# Patient Record
Sex: Female | Born: 1940
Health system: Southern US, Community
[De-identification: ages and names within clinical notes are randomized; demographics above are authoritative.]

## PROBLEM LIST (undated history)

## (undated) DIAGNOSIS — E079 Disorder of thyroid, unspecified: Secondary | ICD-10-CM

## (undated) HISTORY — DX: Disorder of thyroid, unspecified: E07.9

---

## 2013-06-29 DIAGNOSIS — R4182 Altered mental status, unspecified: Secondary | ICD-10-CM | POA: Insufficient documentation

## 2013-06-29 DIAGNOSIS — K219 Gastro-esophageal reflux disease without esophagitis: Secondary | ICD-10-CM | POA: Insufficient documentation

## 2013-06-29 DIAGNOSIS — D649 Anemia, unspecified: Secondary | ICD-10-CM | POA: Insufficient documentation

## 2013-06-29 DIAGNOSIS — Z853 Personal history of malignant neoplasm of breast: Secondary | ICD-10-CM | POA: Insufficient documentation

## 2013-09-17 DIAGNOSIS — G4700 Insomnia, unspecified: Secondary | ICD-10-CM | POA: Insufficient documentation

## 2013-09-17 DIAGNOSIS — F419 Anxiety disorder, unspecified: Secondary | ICD-10-CM | POA: Insufficient documentation

## 2015-12-08 LAB — HM DEXA SCAN

## 2015-12-08 LAB — HM MAMMOGRAPHY

## 2016-11-16 ENCOUNTER — Ambulatory Visit (INDEPENDENT_AMBULATORY_CARE_PROVIDER_SITE_OTHER): Payer: Medicare Other | Admitting: Osteopathic Medicine

## 2016-11-16 DIAGNOSIS — Z0289 Encounter for other administrative examinations: Secondary | ICD-10-CM

## 2016-11-16 NOTE — Progress Notes (Signed)
Patient was here for her appointment but then left, was gone past her appointment time, did not alert front desk - we checked the parking lot to make sure she wasn't out there fallen or other problem. I'm going to therefore count this as a no show since patient was not ready at time of appointment or 10 minutes after her appointment time. If late/no-show again, will not accept as new patient.

## 2016-11-24 ENCOUNTER — Ambulatory Visit (INDEPENDENT_AMBULATORY_CARE_PROVIDER_SITE_OTHER): Payer: Medicare Other | Admitting: Osteopathic Medicine

## 2016-11-24 ENCOUNTER — Encounter: Payer: Self-pay | Admitting: Osteopathic Medicine

## 2016-11-24 VITALS — BP 147/74 | HR 76 | Ht 64.0 in | Wt 122.0 lb

## 2016-11-24 DIAGNOSIS — I499 Cardiac arrhythmia, unspecified: Secondary | ICD-10-CM

## 2016-11-24 DIAGNOSIS — E785 Hyperlipidemia, unspecified: Secondary | ICD-10-CM

## 2016-11-24 DIAGNOSIS — I6523 Occlusion and stenosis of bilateral carotid arteries: Secondary | ICD-10-CM | POA: Diagnosis not present

## 2016-11-24 DIAGNOSIS — L409 Psoriasis, unspecified: Secondary | ICD-10-CM | POA: Insufficient documentation

## 2016-11-24 DIAGNOSIS — E039 Hypothyroidism, unspecified: Secondary | ICD-10-CM

## 2016-11-24 DIAGNOSIS — F419 Anxiety disorder, unspecified: Secondary | ICD-10-CM

## 2016-11-24 DIAGNOSIS — K219 Gastro-esophageal reflux disease without esophagitis: Secondary | ICD-10-CM

## 2016-11-24 DIAGNOSIS — Z0289 Encounter for other administrative examinations: Secondary | ICD-10-CM

## 2016-11-24 DIAGNOSIS — I6529 Occlusion and stenosis of unspecified carotid artery: Secondary | ICD-10-CM | POA: Insufficient documentation

## 2016-11-24 MED ORDER — PAROXETINE HCL 40 MG PO TABS: 40.0000 mg | ORAL_TABLET | Freq: Every day | ORAL | 3 refills | Status: DC

## 2016-11-24 MED ORDER — OMEPRAZOLE 20 MG PO CPDR: 20.0000 mg | DELAYED_RELEASE_CAPSULE | Freq: Every day | ORAL | 3 refills | Status: DC

## 2016-11-24 NOTE — Patient Instructions (Signed)
Plan: 1. Labs today to check thyroid.  2. Refills sent for needed prescriptions.  3. As long as you are feeling well, plan to come back for your annual wellness physical and routine labs in the fall. Please call our office a week before your appointment (or when you get the confirmation/reminder phone call from [place]) so we can make sure your lab orders are in place. If you're able to get labs done a few days before your visit, we can discuss results when we see you in the office! If any problems arise prior to your wellness visit, please come see [place]!

## 2016-11-24 NOTE — Progress Notes (Signed)
HPI: Angela Myers is a 76 y.o. female  who presents to [facility] today, 11/24/16,  for chief complaint of:  Chief Complaint  Patient presents with  . Establish Care    Here to establish care.. Pt has no major concerns to address today.    CARDIOVASCULAR - Hx carotid stenosis on problem list but review of records notes no significant stenosis to require intervention (as of 2016). Hx HLD - is on statin and aspirin. No known Hx CAD. No CP/SOB, no palpitations.   RESPIRATORY - no issues  NEUROLOGICAL/PSYCH - anxiety issues well controlled on Paxil.   URINARY - occasional urgency, doesn't want to be on medications.   GASTROINTESTINAL - GERD well controlled on Prilosec, would like to continue this medication after discussion of risks/benefits long term PPI.   ENDOCRINE - Hypothyroid, doing well on current dose medication which she has been taking for years.     Past medical, surgical, social and family history reviewed: Patient Active Problem List   Diagnosis Date Noted  . Hypothyroidism 11/24/2016  . Carotid artery stenosis 11/24/2016  . HLD (hyperlipidemia) 11/24/2016  . Psoriasis 11/24/2016  . Anxiety disorder 09/17/2013  . Anemia 06/29/2013  . GERD (gastroesophageal reflux disease) 06/29/2013  . History of breast cancer 06/29/2013   History reviewed. No pertinent surgical history. Social History  Substance Use Topics  . Smoking status: Unknown If Ever Smoked  . Smokeless tobacco: Never Used  . Alcohol use Not on file   History reviewed. No pertinent family history.   Current medication list and allergy/intolerance information reviewed:   Current Outpatient Prescriptions  Medication Sig Dispense Refill  . PARoxetine (PAXIL) 40 MG tablet TAKE 1 TAB(S) ONCE DAILY ORALLY    . aspirin EC 81 MG tablet Take by mouth.    . levothyroxine (SYNTHROID, LEVOTHROID) 88 MCG tablet Take by mouth.    Marland Kitchen omeprazole (PRILOSEC) 20 MG capsule Take  by mouth.    . simvastatin (ZOCOR) 80 MG tablet      No current facility-administered medications for this visit.    Allergies  Allergen Reactions  . Fesoterodine Other (See Comments)    Abdominal pain 08/30/13 patient stated does not recall allergy  . Gabapentin Other (See Comments)    "felt like she was going to die" 08/30/13 patient stated does not recall allergy      Review of Systems:  Constitutional:  No  fever, no chills, No recent illness  HEENT: No  headache, no vision change, no hearing change,  Cardiac: No  chest pain, No  pressure, No palpitations  Respiratory:  No  shortness of breath. No  Cough  Gastrointestinal: No  abdominal pain, No  nausea, No  vomiting,  No  blood in stool, No  diarrhea, No  constipation   Musculoskeletal: No new myalgia/arthralgia  Genitourinary: +urinary incontinence, No  abnormal genital bleeding, No abnormal genital discharge  Skin: +Rash, No other wounds/concerning lesions  Hem/Onc: No  easy bruising/bleeding, No  abnormal lymph node  Endocrine: No cold intolerance,  No heat intolerance. No polyuria/polydipsia/polyphagia   Neurologic: No  weakness, No  dizziness, No  slurred speech/focal weakness/facial droop, +memory concerns  Psychiatric: No  concerns with depression, No  concerns with anxiety, No sleep problems, No mood problems  Exam:  BP (!) 147/74   Pulse 76   Ht 5\' 4"  (1.626 m)   Wt 122 lb (55.3 kg)   BMI 20.94 kg/m   Constitutional: VS see above. General Appearance:  alert, well-developed, well-nourished, NAD  Eyes: Normal lids and conjunctive, non-icteric sclera  Ears, Nose, Mouth, Throat: MMM, Normal external inspection ears/nares/mouth/lips/gums.   Neck: No masses, trachea midline. No thyroid enlargement. No tenderness/mass appreciated. No lymphadenopathy  Respiratory: Normal respiratory effort. no wheeze, no rhonchi, no rales  Cardiovascular: S1/S2 normal, no murmur, no rub/gallop auscultated. RRR except  early beats on occasion to auscultation. No lower extremity edema.   Gastrointestinal: Nontender, no masses. No hepatomegaly, no splenomegaly. No hernia appreciated. Bowel sounds normal. Rectal exam deferred.   Musculoskeletal: Gait normal. No clubbing/cyanosis of digits.   Neurological: Normal balance/coordination. No tremor.   Skin: warm, dry, intact. No rash/ulcer.  Psychiatric: Normal judgment/insight. Normal mood and affect. Oriented x3.    Results for orders placed or performed in visit on 11/24/16 (from the past 72 hour(s))  TSH     Status: None   Collection Time: 11/24/16  3:37 PM  Result Value Ref Range   TSH 0.72 mIU/L    Comment:   Reference Range   > or = 20 Years  0.40-4.50   Pregnancy Range First trimester  0.26-2.66 Second trimester 0.55-2.73 Third trimester  0.43-2.91        EKG interpretation: Rate: 68 Rhythm: sinus, regular  No ST/T changes concerning for acute ischemia/infarct     ASSESSMENT/PLAN:   Hypothyroidism, unspecified type - Plan: levothyroxine (SYNTHROID, LEVOTHROID) 100 MCG tablet, TSH  Irregular heart beat - Plan: EKG 12-Lead  Carotid atherosclerosis, bilateral - Plan: aspirin EC 81 MG tablet  Anxiety disorder, unspecified type - Plan: PARoxetine (PAXIL) 40 MG tablet  Gastroesophageal reflux disease, esophagitis presence not specified - Plan: omeprazole (PRILOSEC) 20 MG capsule  Self-referral - Plan: cyanocobalamin (V-R VITAMIN B-12) 500 MCG tablet, Pumpkin Seed-Soy Germ (AZO BLADDER CONTROL/GO-LESS) CAPS  Hyperlipidemia, unspecified hyperlipidemia type - Plan: simvastatin (ZOCOR) 80 MG tablet    Patient Instructions  Plan: 1. Labs today to check thyroid.  2. Refills sent for needed prescriptions.  3. As long as you are feeling well, plan to come back for your annual wellness physical and routine labs in the fall. Please call our office a week before your appointment (or when you get the confirmation/reminder phone call from  [place]) so we can make sure your lab orders are in place. If you're able to get labs done a few days before your visit, we can discuss results when we see you in the office! If any problems arise prior to your wellness visit, please come see [place]!     Visit summary with medication list and pertinent instructions was printed for patient to review. All questions at time of visit were answered - patient instructed to contact office with any additional concerns. ER/RTC precautions were reviewed with the patient. Follow-up plan: Return in about 6 months (around 05/26/2017) for [place], sooner if needed .   Note: Total time spent 25 minutes, greater than 50% of the visit was spent face-to-face counseling and coordinating care for the following: The primary encounter diagnosis was Hypothyroidism, unspecified type. Diagnoses of Irregular heart beat, Carotid atherosclerosis, bilateral, Anxiety disorder, unspecified type, Gastroesophageal reflux disease, esophagitis presence not specified, Self-referral, and Hyperlipidemia, unspecified hyperlipidemia type were also pertinent to this visit.Marland Kitchen

## 2016-11-25 ENCOUNTER — Encounter: Payer: Self-pay | Admitting: Osteopathic Medicine

## 2016-11-25 LAB — TSH: TSH: 0.72 mIU/L

## 2016-11-26 ENCOUNTER — Other Ambulatory Visit: Payer: Self-pay

## 2016-11-26 ENCOUNTER — Encounter: Payer: Self-pay | Admitting: Osteopathic Medicine

## 2016-11-26 DIAGNOSIS — F419 Anxiety disorder, unspecified: Secondary | ICD-10-CM

## 2016-11-26 MED ORDER — PAROXETINE HCL 40 MG PO TABS: 40.0000 mg | ORAL_TABLET | Freq: Every day | ORAL | 3 refills | Status: DC

## 2016-11-26 NOTE — Telephone Encounter (Signed)
Patient request refill for Paroxetine 40 mg #90 3 refills sent to [facility]  pharmacy. Trinetta Alemu,CMA

## 2016-11-29 ENCOUNTER — Encounter: Payer: Self-pay | Admitting: Osteopathic Medicine

## 2017-02-24 ENCOUNTER — Ambulatory Visit (INDEPENDENT_AMBULATORY_CARE_PROVIDER_SITE_OTHER): Payer: Medicare Other | Admitting: Osteopathic Medicine

## 2017-02-24 ENCOUNTER — Encounter: Payer: Self-pay | Admitting: Osteopathic Medicine

## 2017-02-24 VITALS — BP 136/60 | HR 80 | Wt 123.0 lb

## 2017-02-24 DIAGNOSIS — E039 Hypothyroidism, unspecified: Secondary | ICD-10-CM | POA: Diagnosis not present

## 2017-02-24 DIAGNOSIS — E559 Vitamin D deficiency, unspecified: Secondary | ICD-10-CM | POA: Diagnosis not present

## 2017-02-24 DIAGNOSIS — I6523 Occlusion and stenosis of bilateral carotid arteries: Secondary | ICD-10-CM | POA: Diagnosis not present

## 2017-02-24 DIAGNOSIS — E785 Hyperlipidemia, unspecified: Secondary | ICD-10-CM | POA: Diagnosis not present

## 2017-02-24 DIAGNOSIS — Z0000 Encounter for general adult medical examination without abnormal findings: Secondary | ICD-10-CM | POA: Diagnosis not present

## 2017-02-24 MED ORDER — ZOSTER VAC RECOMB ADJUVANTED 50 MCG/0.5ML IM SUSR: 0.5000 mL | Freq: Once | INTRAMUSCULAR | 1 refills | Status: AC

## 2017-02-24 NOTE — Patient Instructions (Addendum)
Plan:  Prescription for shingles shot if you decide to get this you can take it to your pharmacist   See printed information on advanced directive  Labs will be due 05/2017  If leg numbness or veins bother you, please come see me!

## 2017-02-24 NOTE — Progress Notes (Signed)
HPI: Angela Myers is a 76 y.o. female  who presents to [facility] today, 02/24/17,  for Medicare Annual Wellness Exam  Patient presents for annual physical/Medicare wellness exam. no complaints today.    Past medical, surgical, social and family history reviewed:  Patient Active Problem List   Diagnosis Date Noted  . Hypothyroidism 11/24/2016  . Carotid artery stenosis 11/24/2016  . HLD (hyperlipidemia) 11/24/2016  . Psoriasis 11/24/2016  . Anxiety disorder 09/17/2013  . Anemia 06/29/2013  . GERD (gastroesophageal reflux disease) 06/29/2013  . History of breast cancer 06/29/2013    No past surgical history on file.  Social History   Social History  . Marital status: Married    Spouse name: N/A  . Number of children: N/A  . Years of education: N/A   Occupational History  . Not on file.   Social History Main Topics  . Smoking status: Unknown If Ever Smoked  . Smokeless tobacco: Never Used  . Alcohol use Not on file  . Drug use: Unknown  . Sexual activity: Not on file   Other Topics Concern  . Not on file   Social History Narrative  . No narrative on file    Family History  Problem Relation Age of Onset  . Diabetes Maternal Aunt   . Heart attack Maternal Aunt      Current medication list and allergy/intolerance information reviewed:    Outpatient Encounter Prescriptions as of 02/24/2017  Medication Sig  . aspirin EC 81 MG tablet Take by mouth.  . cyanocobalamin (V-R VITAMIN B-12) 500 MCG tablet Take by mouth.  . levothyroxine (SYNTHROID, LEVOTHROID) 100 MCG tablet   . omeprazole (PRILOSEC) 20 MG capsule Take 1 capsule (20 mg total) by mouth daily.  Marland Kitchen PARoxetine (PAXIL) 40 MG tablet Take 1 tablet (40 mg total) by mouth daily.  . Pumpkin Seed-Soy Germ (AZO BLADDER CONTROL/GO-LESS) CAPS Take by mouth.  . simvastatin (ZOCOR) 80 MG tablet Take 80 mg by mouth daily. TAKE 1/2 TABLET DAILY   No facility-administered  encounter medications on file as of 02/24/2017.     Allergies  Allergen Reactions  . Fesoterodine Other (See Comments)    Abdominal pain 08/30/13 patient stated does not recall allergy  . Gabapentin Other (See Comments)    "felt like she was going to die" 08/30/13 patient stated does not recall allergy       Review of Systems: Review of Systems - negative   Medicare Wellness Questionnaire  Are there smokers in your home (other than you)? no  Depression Screen (Note: if answer to either of the following is "Yes", a more complete depression screening is indicated)   Q1: Over the past two weeks, have you felt down, depressed or hopeless? no  Q2: Over the past two weeks, have you felt little interest or pleasure in doing things? "well I do the same things every day!"  Have you lost interest or pleasure in daily life? no  Do you often feel hopeless? no  Do you cry easily over simple problems? no  Activities of Daily Living In your present state of health, do you have any difficulty performing the following activities?:  Driving? no Managing money?  no Feeding yourself? no Getting from bed to chair? no Climbing a flight of stairs? no Preparing food and eating?: no Bathing or showering? no Getting dressed: no Getting to the toilet? no Using the toilet: no Moving around from place to place: no In the past year  have you fallen or had a near fall?: no  Hearing Difficulties:  Do you often ask people to speak up or repeat themselves? no Do you experience ringing or noises in your ears? no  Do you have difficulty understanding soft or whispered voices? no  Memory Difficulties:  Do you feel that you have a problem with memory? no  Do you often misplace items? yes  Do you feel safe at home?  yes  Sexual Health:   Are you sexually active?  Yes w/ husband   Do you have more than one partner?  No  Advanced Directives:   Advanced directives discussed: has NO advanced directive   - add't info requested. Referral to SW: not applicable  Additional information provided: yes  Risk Factors  Current exercise habits: none formal  Dietary issues discussed:no major concerns  Cardiac risk factors: known cardiac disease, hypertension, hypercholesterolemia/hyperlipidemia, generalized debilitation   Exam:  BP 136/60   Pulse 80   Wt 123 lb (55.8 kg)   SpO2 97%   BMI 21.11 kg/m   Constitutional: VS see above. General Appearance: alert, well-developed, well-nourished, NAD  Ears, Nose, Mouth, Throat: MMM  Neck: No masses, trachea midline.   Respiratory: Normal respiratory effort. no wheeze, no rhonchi, no rales  Cardiovascular:No lower extremity edema.   Musculoskeletal: Gait normal. No clubbing/cyanosis of digits.   Neurological: Normal balance/coordination. No tremor. Recalls 3 objects and able to read face of watch with correct time.   Skin: warm, dry, intact. No rash/ulcer.   Psychiatric: Normal judgment/insight. Normal mood and affect. Oriented x3.     ASSESSMENT/PLAN:   Encounter for Medicare annual wellness exam  Medicare annual wellness visit, subsequent - Plan: CBC, COMPLETE METABOLIC PANEL WITH GFR, Lipid panel, TSH, VITAMIN D 25 Hydroxy (Vit-D Deficiency, Fractures)  Hypothyroidism, unspecified type - Plan: TSH  Hyperlipidemia, unspecified hyperlipidemia type - Plan: Lipid panel, TSH  Carotid atherosclerosis, bilateral - Plan: CBC, COMPLETE METABOLIC PANEL WITH GFR, Lipid panel  Vitamin D deficiency - Plan: VITAMIN D 25 Hydroxy (Vit-D Deficiency, Fractures)  Patient Instructions  Plan:  Prescription for shingles shot if you decide to get this you can take it to your pharmacist   See printed information on advanced directive  Labs will be due 05/2017  If leg numbness or veins bother you, please come see me!     CANCER SCREENING  Lung - does not need  Colon - does not need  Prostate - does not need  Breast - does not  need  Cervical - does not need OTHER DISEASE SCREENING  Lipid - needs  DM2 - needs  AAA - female 37-75yo ever smoked - does not need  Osteoporosis - women 75yo+, men 75yo+ - does not need - not due yet  INFECTIOUS DISEASE SCREENING  HIV - does not need  GC/CT - does not need  HepC -does not need  TB - does not need ADULT VACCINATION  Influenza - annual vaccine recommended  Td - booster every 10 years - recommended  Zoster - option at 55, yes at 34+ - recommended   PCV13 - does not need  PPSV23 - does not need Immunization History  Administered Date(s) Administered  . H1N1 07/18/2008  . Influenza-Unspecified 04/16/2010, 06/03/2014, 10/13/2015, 05/06/2016  . Pneumococcal Conjugate-13 11/13/2015  . Pneumococcal Polysaccharide-23 02/16/2007   OTHER  Fall - exercise and Vit D age 33+ - does not need  Advanced Directives -  Discussed as above   During the course of the  visit the patient was educated and counseled about appropriate screening and preventive services as noted above.   Patient Instructions (the written plan) was given to the patient.  Medicare Attestation I have personally reviewed: The patient's medical and social history Their use of alcohol, tobacco or illicit drugs Their current medications and supplements The patient's functional ability including ADLs,fall risks, home safety risks, cognitive, and hearing and visual impairment Diet and physical activities Evidence for depression or mood disorders  The patient's weight, height, BMI, and visual acuity have been recorded in the chart.  I have made referrals, counseling, and provided education to the patient based on review of the above and I have provided the patient with a written personalized care plan for preventive services.     Emeterio Reeve, DO   02/24/17   Visit summary with medication list and pertinent instructions was printed for patient to review. All questions at time of visit  were answered - patient instructed to contact office with any additional concerns. ER/RTC precautions were reviewed with the patient. Follow-up plan: Return in about 6 months (around 08/27/2017) for routine checkup and review labs .

## 2017-04-06 ENCOUNTER — Other Ambulatory Visit: Payer: Self-pay

## 2017-04-06 DIAGNOSIS — E785 Hyperlipidemia, unspecified: Secondary | ICD-10-CM

## 2017-04-06 MED ORDER — SIMVASTATIN 80 MG PO TABS: 80.0000 mg | ORAL_TABLET | Freq: Every day | ORAL | 1 refills | Status: DC

## 2017-04-06 NOTE — Telephone Encounter (Signed)
Patient request refill for Simvastatin 80 mg. #90 1 refill sent to [facility]. Rhonda Cunningham,CMA

## 2017-04-23 DIAGNOSIS — Z79899 Other long term (current) drug therapy: Secondary | ICD-10-CM | POA: Diagnosis not present

## 2017-04-23 DIAGNOSIS — J929 Pleural plaque without asbestos: Secondary | ICD-10-CM | POA: Diagnosis not present

## 2017-04-23 DIAGNOSIS — E278 Other specified disorders of adrenal gland: Secondary | ICD-10-CM | POA: Diagnosis not present

## 2017-04-23 DIAGNOSIS — K760 Fatty (change of) liver, not elsewhere classified: Secondary | ICD-10-CM | POA: Diagnosis not present

## 2017-04-23 DIAGNOSIS — R918 Other nonspecific abnormal finding of lung field: Secondary | ICD-10-CM | POA: Diagnosis not present

## 2017-04-23 DIAGNOSIS — R5383 Other fatigue: Secondary | ICD-10-CM | POA: Diagnosis not present

## 2017-04-23 DIAGNOSIS — R05 Cough: Secondary | ICD-10-CM | POA: Diagnosis not present

## 2017-04-23 DIAGNOSIS — R509 Fever, unspecified: Secondary | ICD-10-CM | POA: Diagnosis not present

## 2017-04-23 DIAGNOSIS — J189 Pneumonia, unspecified organism: Secondary | ICD-10-CM | POA: Diagnosis not present

## 2017-04-23 DIAGNOSIS — Z87891 Personal history of nicotine dependence: Secondary | ICD-10-CM | POA: Diagnosis not present

## 2017-04-23 DIAGNOSIS — R0602 Shortness of breath: Secondary | ICD-10-CM | POA: Diagnosis not present

## 2017-04-23 DIAGNOSIS — J181 Lobar pneumonia, unspecified organism: Secondary | ICD-10-CM | POA: Diagnosis not present

## 2017-04-23 DIAGNOSIS — R000 Tachycardia, unspecified: Secondary | ICD-10-CM | POA: Diagnosis not present

## 2017-04-23 DIAGNOSIS — R590 Localized enlarged lymph nodes: Secondary | ICD-10-CM | POA: Diagnosis not present

## 2017-04-23 DIAGNOSIS — R0789 Other chest pain: Secondary | ICD-10-CM | POA: Diagnosis not present

## 2017-04-23 DIAGNOSIS — E039 Hypothyroidism, unspecified: Secondary | ICD-10-CM | POA: Diagnosis not present

## 2017-04-23 DIAGNOSIS — Z7982 Long term (current) use of aspirin: Secondary | ICD-10-CM | POA: Diagnosis not present

## 2017-04-23 DIAGNOSIS — Z888 Allergy status to other drugs, medicaments and biological substances status: Secondary | ICD-10-CM | POA: Diagnosis not present

## 2017-04-23 DIAGNOSIS — E785 Hyperlipidemia, unspecified: Secondary | ICD-10-CM | POA: Diagnosis not present

## 2017-04-23 DIAGNOSIS — Z8673 Personal history of transient ischemic attack (TIA), and cerebral infarction without residual deficits: Secondary | ICD-10-CM | POA: Diagnosis not present

## 2017-04-23 DIAGNOSIS — Z9010 Acquired absence of unspecified breast and nipple: Secondary | ICD-10-CM | POA: Diagnosis not present

## 2017-04-27 ENCOUNTER — Ambulatory Visit (INDEPENDENT_AMBULATORY_CARE_PROVIDER_SITE_OTHER): Payer: Medicare Other | Admitting: Osteopathic Medicine

## 2017-04-27 ENCOUNTER — Encounter: Payer: Self-pay | Admitting: Osteopathic Medicine

## 2017-04-27 VITALS — BP 138/68 | HR 98 | Temp 97.8°F | Wt 121.0 lb

## 2017-04-27 DIAGNOSIS — J189 Pneumonia, unspecified organism: Secondary | ICD-10-CM

## 2017-04-27 DIAGNOSIS — J181 Lobar pneumonia, unspecified organism: Secondary | ICD-10-CM

## 2017-04-27 DIAGNOSIS — R05 Cough: Secondary | ICD-10-CM | POA: Diagnosis not present

## 2017-04-27 DIAGNOSIS — R059 Cough, unspecified: Secondary | ICD-10-CM

## 2017-04-27 MED ORDER — IPRATROPIUM-ALBUTEROL 0.5-2.5 (3) MG/3ML IN SOLN
3.0000 mL | Freq: Four times a day (QID) | RESPIRATORY_TRACT | Status: DC
  Administered 2017-04-27: 3 mL via RESPIRATORY_TRACT

## 2017-04-27 MED ORDER — NEBULIZER MISC: 99 refills | Status: DC

## 2017-04-27 MED ORDER — IPRATROPIUM-ALBUTEROL 0.5-2.5 (3) MG/3ML IN SOLN: 3.0000 mL | RESPIRATORY_TRACT | 0 refills | Status: DC | PRN

## 2017-04-27 NOTE — Progress Notes (Signed)
HPI: Angela Myers is a 76 y.o. female  who presents to [facility] today, 04/27/17,  for chief complaint of:  Chief Complaint  Patient presents with  . Hospitalization Follow-up    PNEUMONIA    Seen 04/23/17 (4 days ago) in ER for pneumonia both lower lobes. Treated w/ Levaquin 500 daily x10 days, also given Tessalon. WBC 16.7. CXR (+) RLL concern, CTA done for (+)D-Dimer - see results below. Feels tired and still coughing but overall a little bit better, no fever/chills.   CT "IMPRESSION: 1. No pulmonary embolism. 2. Bilateral pulmonary infiltrates.  3. Mediastinal hilar adenopathy, may be reactive. 4. Fatty liver. Enhancing focus in the posterior hepatic dome, likely a small vascular malformation. 5. Stable bilateral adrenal nodules, previously characterized as adenomas"      Past medical, surgical, social and family history reviewed: Patient Active Problem List   Diagnosis Date Noted  . Hypothyroidism 11/24/2016  . Carotid artery stenosis 11/24/2016  . HLD (hyperlipidemia) 11/24/2016  . Psoriasis 11/24/2016  . Anxiety disorder 09/17/2013  . Anemia 06/29/2013  . GERD (gastroesophageal reflux disease) 06/29/2013  . History of breast cancer 06/29/2013   No past surgical history on file. Social History  Substance Use Topics  . Smoking status: Unknown If Ever Smoked  . Smokeless tobacco: Never Used  . Alcohol use Not on file   Family History  Problem Relation Age of Onset  . Diabetes Maternal Aunt   . Heart attack Maternal Aunt      Current medication list and allergy/intolerance information reviewed:   Current Outpatient Prescriptions  Medication Sig Dispense Refill  . aspirin EC 81 MG tablet Take by mouth.    . cyanocobalamin (V-R VITAMIN B-12) 500 MCG tablet Take by mouth.    . levothyroxine (SYNTHROID, LEVOTHROID) 100 MCG tablet     . omeprazole (PRILOSEC) 20 MG capsule Take 1 capsule (20 mg total) by mouth daily. 90  capsule 3  . PARoxetine (PAXIL) 40 MG tablet Take 1 tablet (40 mg total) by mouth daily. 90 tablet 3  . Pumpkin Seed-Soy Germ (AZO BLADDER CONTROL/GO-LESS) CAPS Take by mouth.    . simvastatin (ZOCOR) 80 MG tablet Take 1 tablet (80 mg total) by mouth daily. TAKE 1/2 TABLET DAILY 90 tablet 1   No current facility-administered medications for this visit.    Allergies  Allergen Reactions  . Fesoterodine Other (See Comments)    Abdominal pain 08/30/13 patient stated does not recall allergy  . Gabapentin Other (See Comments)    "felt like she was going to die" 08/30/13 patient stated does not recall allergy      Review of Systems:  Constitutional:  No  fever, no chills, +recent illness, No unintentional weight changes. +significant fatigue.   HEENT: No  headache, no vision change  Cardiac: No  chest pain, No  pressure, No palpitations  Respiratory:  No  shortness of breath. No  Cough  Gastrointestinal: No  abdominal pain, No  nausea, No  vomiting  Skin: No  Rash  Neurologic: No  weakness, No  dizziness  Exam:  BP 138/68   Pulse 98   Temp 97.8 F (36.6 C) (Oral)   Wt 121 lb (54.9 kg)   BMI 20.77 kg/m   Constitutional: VS see above. General Appearance: alert, well-developed, well-nourished, NAD  Eyes: Normal lids and conjunctive, non-icteric sclera  Ears, Nose, Mouth, Throat: MMM, Normal external inspection ears/nares/mouth/lips/gums.  Neck: No masses, trachea midline. No thyroid enlargement  Respiratory: Normal  respiratory effort. no wheeze, no rhonchi, no rales  Cardiovascular: S1/S2 normal, no murmur, no rub/gallop auscultated. RRR. No lower extremity edema.   Neurological: Normal balance/coordination. No tremor.   Skin: warm, dry, intact.     ASSESSMENT/PLAN:   Pneumonia of both lower lobes due to infectious organism ([place]) - Continue antibiotics. Neb tx in office today helped, will write for nebs to use at home  - Plan: ipratropium-albuterol (DUONEB) 0.5-2.5  (3) MG/3ML nebulizer solution 3 mL, Nebulizer MISC, ipratropium-albuterol (DUONEB) 0.5-2.5 (3) MG/3ML SOLN  Cough - Plan: Nebulizer MISC, ipratropium-albuterol (DUONEB) 0.5-2.5 (3) MG/3ML SOLN    Patient Instructions  Plan:  Continue antibiotics and cough medicine  New prescription for nebulizer machine and medication to use to help clear the lungs a bit - let us know if there are problems getting these supplies/medicines   If worsening shortness of breath, fever, or other problems - please go to hospital to get rechecked   Since not feeling much better, though not worse, will follow closely. Advised that illness can certainly cause fatigue, stressed importance of hydration/nutrition and rest  Visit summary with medication list and pertinent instructions was printed for patient to review. All questions at time of visit were answered - patient instructed to contact office with any additional concerns. ER/RTC precautions were reviewed with the patient. Follow-up plan: Return in about 1 week (around 05/04/2017) for recheck breathing and cough, sooner if needed.

## 2017-04-27 NOTE — Patient Instructions (Signed)
Plan:  Continue antibiotics and cough medicine  New prescription for nebulizer machine and medication to use to help clear the lungs a bit - let us know if there are problems getting these supplies/medicines   If worsening shortness of breath, fever, or other problems - please go to hospital to get rechecked

## 2017-04-28 ENCOUNTER — Telehealth: Payer: Self-pay

## 2017-04-28 MED ORDER — IPRATROPIUM-ALBUTEROL 20-100 MCG/ACT IN AERS: 1.0000 | INHALATION_SPRAY | Freq: Four times a day (QID) | RESPIRATORY_TRACT | 1 refills | Status: DC | PRN

## 2017-04-28 NOTE — Telephone Encounter (Signed)
Sent!

## 2017-04-28 NOTE — Telephone Encounter (Signed)
Pt's pharmacy does not carry nebulizer, her husband is asking for just an inhaler for patient to use.  Please advise.

## 2017-05-04 ENCOUNTER — Ambulatory Visit: Payer: Medicare Other | Admitting: Osteopathic Medicine

## 2017-05-04 DIAGNOSIS — Z0189 Encounter for other specified special examinations: Secondary | ICD-10-CM

## 2017-05-25 ENCOUNTER — Other Ambulatory Visit: Payer: Self-pay | Admitting: Osteopathic Medicine

## 2017-05-25 DIAGNOSIS — Z0000 Encounter for general adult medical examination without abnormal findings: Secondary | ICD-10-CM

## 2017-05-25 DIAGNOSIS — E039 Hypothyroidism, unspecified: Secondary | ICD-10-CM

## 2017-05-25 DIAGNOSIS — E785 Hyperlipidemia, unspecified: Secondary | ICD-10-CM

## 2017-05-25 NOTE — Progress Notes (Signed)
Labs reordered, last were put in for [facility].

## 2017-05-26 DIAGNOSIS — E785 Hyperlipidemia, unspecified: Secondary | ICD-10-CM | POA: Diagnosis not present

## 2017-05-26 DIAGNOSIS — E559 Vitamin D deficiency, unspecified: Secondary | ICD-10-CM | POA: Diagnosis not present

## 2017-05-26 DIAGNOSIS — R030 Elevated blood-pressure reading, without diagnosis of hypertension: Secondary | ICD-10-CM | POA: Diagnosis not present

## 2017-05-26 DIAGNOSIS — Z0000 Encounter for general adult medical examination without abnormal findings: Secondary | ICD-10-CM | POA: Diagnosis not present

## 2017-05-26 DIAGNOSIS — E039 Hypothyroidism, unspecified: Secondary | ICD-10-CM | POA: Diagnosis not present

## 2017-05-26 DIAGNOSIS — R7989 Other specified abnormal findings of blood chemistry: Secondary | ICD-10-CM | POA: Diagnosis not present

## 2017-05-26 DIAGNOSIS — Z1321 Encounter for screening for nutritional disorder: Secondary | ICD-10-CM | POA: Diagnosis not present

## 2017-05-27 LAB — CBC WITH DIFFERENTIAL/PLATELET
BASOS ABS: 40 {cells}/uL (ref 0–200)
Basophils Relative: 0.7 %
EOS PCT: 3.5 %
Eosinophils Absolute: 200 cells/uL (ref 15–500)
HEMATOCRIT: 35.5 % (ref 35.0–45.0)
HEMOGLOBIN: 11.8 g/dL (ref 11.7–15.5)
LYMPHS ABS: 1835 {cells}/uL (ref 850–3900)
MCH: 30.3 pg (ref 27.0–33.0)
MCHC: 33.2 g/dL (ref 32.0–36.0)
MCV: 91 fL (ref 80.0–100.0)
MPV: 10.1 fL (ref 7.5–12.5)
Monocytes Relative: 10.3 %
NEUTROS ABS: 3038 {cells}/uL (ref 1500–7800)
Neutrophils Relative %: 53.3 %
Platelets: 197 10*3/uL (ref 140–400)
RBC: 3.9 10*6/uL (ref 3.80–5.10)
RDW: 13.2 % (ref 11.0–15.0)
Total Lymphocyte: 32.2 %
WBC: 5.7 10*3/uL (ref 3.8–10.8)
WBCMIX: 587 {cells}/uL (ref 200–950)

## 2017-05-27 LAB — COMPLETE METABOLIC PANEL WITH GFR
AG RATIO: 1.6 (calc) (ref 1.0–2.5)
ALT: 7 U/L (ref 6–29)
AST: 16 U/L (ref 10–35)
Albumin: 4.1 g/dL (ref 3.6–5.1)
Alkaline phosphatase (APISO): 63 U/L (ref 33–130)
BUN: 11 mg/dL (ref 7–25)
CO2: 28 mmol/L (ref 20–32)
Calcium: 9.1 mg/dL (ref 8.6–10.4)
Chloride: 105 mmol/L (ref 98–110)
Creat: 0.84 mg/dL (ref 0.60–0.93)
GFR, EST AFRICAN AMERICAN: 79 mL/min/{1.73_m2} (ref >=60)
GFR, EST NON AFRICAN AMERICAN: 68 mL/min/{1.73_m2} (ref >=60)
GLOBULIN: 2.5 g/dL (ref 1.9–3.7)
Glucose, Bld: 86 mg/dL (ref 65–99)
POTASSIUM: 4.1 mmol/L (ref 3.5–5.3)
SODIUM: 140 mmol/L (ref 135–146)
TOTAL PROTEIN: 6.6 g/dL (ref 6.1–8.1)
Total Bilirubin: 0.5 mg/dL (ref 0.2–1.2)

## 2017-05-27 LAB — LIPID PANEL
Cholesterol: 145 mg/dL (ref <200)
HDL: 62 mg/dL (ref >50)
LDL Cholesterol (Calc): 68 mg/dL (calc)
NON-HDL CHOLESTEROL (CALC): 83 mg/dL (ref <130)
TRIGLYCERIDES: 71 mg/dL (ref <150)
Total CHOL/HDL Ratio: 2.3 (calc) (ref <5.0)

## 2017-05-27 LAB — VITAMIN D 25 HYDROXY (VIT D DEFICIENCY, FRACTURES): VIT D 25 HYDROXY: 41 ng/mL (ref 30–100)

## 2017-05-27 LAB — TSH: TSH: 0.78 mIU/L (ref 0.40–4.50)

## 2017-05-30 ENCOUNTER — Ambulatory Visit (INDEPENDENT_AMBULATORY_CARE_PROVIDER_SITE_OTHER): Payer: Medicare Other | Admitting: Osteopathic Medicine

## 2017-05-30 ENCOUNTER — Encounter: Payer: Self-pay | Admitting: Osteopathic Medicine

## 2017-05-30 VITALS — BP 155/68 | HR 74 | Wt 120.0 lb

## 2017-05-30 DIAGNOSIS — R030 Elevated blood-pressure reading, without diagnosis of hypertension: Secondary | ICD-10-CM

## 2017-05-30 DIAGNOSIS — Z23 Encounter for immunization: Secondary | ICD-10-CM

## 2017-05-30 DIAGNOSIS — Z1239 Encounter for other screening for malignant neoplasm of breast: Secondary | ICD-10-CM

## 2017-05-30 DIAGNOSIS — Z0000 Encounter for general adult medical examination without abnormal findings: Secondary | ICD-10-CM

## 2017-05-30 DIAGNOSIS — Z1231 Encounter for screening mammogram for malignant neoplasm of breast: Secondary | ICD-10-CM

## 2017-05-30 NOTE — Progress Notes (Signed)
HPI: Angela Myers is a 76 y.o. female  who presents to [facility] today, 05/30/17,  for chief complaint of:  Chief Complaint  Patient presents with  . Follow-up    LABS      Patient here for annual physical / wellness exam.  See preventive care reviewed as below.  Recent labs reviewed in detail with the patient. This was her main concern today - she has no complaints.   BP elevated - new problem, no CP/SOB, no palpitations, no HA/VC. On no meds, previous BP WNL     Past medical, surgical, social and family history reviewed: Patient Active Problem List   Diagnosis Date Noted  . Hypothyroidism 11/24/2016  . Carotid artery stenosis 11/24/2016  . HLD (hyperlipidemia) 11/24/2016  . Psoriasis 11/24/2016  . Anxiety disorder 09/17/2013  . Anemia 06/29/2013  . GERD (gastroesophageal reflux disease) 06/29/2013  . History of breast cancer 06/29/2013   No past surgical history on file. Social History  Substance Use Topics  . Smoking status: Unknown If Ever Smoked  . Smokeless tobacco: Never Used  . Alcohol use Not on file   Family History  Problem Relation Age of Onset  . Diabetes Maternal Aunt   . Heart attack Maternal Aunt      Current medication list and allergy/intolerance information reviewed:   Current Outpatient Prescriptions  Medication Sig Dispense Refill  . aspirin EC 81 MG tablet Take by mouth.    . cyanocobalamin (V-R VITAMIN B-12) 500 MCG tablet Take by mouth.    . Ipratropium-Albuterol (COMBIVENT) 20-100 MCG/ACT AERS respimat Inhale 1 puff into the lungs every 6 (six) hours as needed for wheezing or shortness of breath. 1 Inhaler 1  . levothyroxine (SYNTHROID, LEVOTHROID) 100 MCG tablet     . Nebulizer MISC Nebulizer machine and necessary tubing and other supplies per patient preference/insurance coverage. Dx: J18.1, R05 1 each prn  . omeprazole (PRILOSEC) 20 MG capsule Take 1 capsule (20 mg total) by mouth daily. 90 capsule  3  . PARoxetine (PAXIL) 40 MG tablet Take 1 tablet (40 mg total) by mouth daily. 90 tablet 3  . Pumpkin Seed-Soy Germ (AZO BLADDER CONTROL/GO-LESS) CAPS Take by mouth.    . simvastatin (ZOCOR) 80 MG tablet Take 1 tablet (80 mg total) by mouth daily. TAKE 1/2 TABLET DAILY 90 tablet 1   Current Facility-Administered Medications  Medication Dose Route Frequency Provider Last Rate Last Dose  . ipratropium-albuterol (DUONEB) 0.5-2.5 (3) MG/3ML nebulizer solution 3 mL  3 mL Nebulization Q6H Emeterio Reeve, DO   3 mL at 04/27/17 1423   Allergies  Allergen Reactions  . Fesoterodine Other (See Comments)    Abdominal pain 08/30/13 patient stated does not recall allergy  . Gabapentin Other (See Comments)    "felt like she was going to die" 08/30/13 patient stated does not recall allergy      Review of Systems:  Constitutional:  No  fever, no chills, No recent illness  HEENT: No  headache, no vision change  Cardiac: No  chest pain, No  pressure, No palpitations  Respiratory:  No  shortness of breath. No  Cough  Gastrointestinal: No  abdominal pain, No  nausea  Musculoskeletal: No new myalgia/arthralgia  Skin: No  Rash, No other wounds/concerning lesions  Hem/Onc: No  easy bruising/bleeding   Neurologic: No  weakness, No  dizziness  Psychiatric: No  concerns with depression, No  concerns with anxiety  Exam:  BP (!) 155/68   Pulse  74   Wt 120 lb (54.4 kg)   BMI 20.60 kg/m   Constitutional: VS see above. General Appearance: alert, well-developed, well-nourished, NAD  Eyes: Normal lids and conjunctive, non-icteric sclera  Ears, Nose, Mouth, Throat: MMM, Normal external inspection ears/nares/mouth/lips/gums.   Neck: No masses, trachea midline. No thyroid enlargement. No tenderness/mass appreciated. No lymphadenopathy  Respiratory: Normal respiratory effort. no wheeze, no rhonchi, no rales  Cardiovascular: S1/S2 normal, no murmur, no rub/gallop auscultated. RRR. No lower  extremity edema.   Musculoskeletal: Gait normal.   Neurological: Normal balance/coordination. No tremor.   Skin: warm, dry, intact. No rash/ulcer.   Psychiatric: Normal judgment/insight. Normal mood and affect. Oriented x3.     ASSESSMENT/PLAN:   Routine physical examination  Elevated blood pressure reading  Need for immunization against influenza - Plan: Flu vaccine HIGH DOSE PF  Breast cancer screening - Plan: MM DIGITAL SCREENING BILATERAL   FEMALE PREVENTIVE CARE Updated 05/30/17   ANNUAL SCREENING/COUNSELING  Diet/Exercise - HEALTHY HABITS DISCUSSED TO DECREASE CV RISK History  Smoking Status  . Unknown If Ever Smoked  Smokeless Tobacco  . Never Used   History  Alcohol use Not on file   Depression screen [facility] 2/9 11/24/2016  Decreased Interest 0  Down, Depressed, Hopeless 0  PHQ - 2 Score 0    Domestic violence concerns - no  HTN SCREENING - SEE [place]  Sexually active in the past year - Yes with female.  Need/want STI testing today? - no  Concerns about libido or pain with sex? - no  Plans for pregnancy? - menopausal - n/a  INFECTIOUS DISEASE SCREENING  HIV - does not need  GC/CT - does not need  HepC - DOB [birth year] - does not need  TB - does not need  DISEASE SCREENING  Lipid - does not need  DM2 - does not need  Osteoporosis - women age 63+ - does not need, recent 2017  CANCER SCREENING  Cervical - does not need  Breast - needs  Lung - does not need  Colon - needs - declined  ADULT VACCINATION  Influenza - annual vaccine recommended  Td - booster every 10 years   Zoster - Shingrix recommended 50+  PCV13 - already has  PPSV23 - already has Immunization History  Administered Date(s) Administered  . H1N1 07/18/2008  . Influenza, High Dose Seasonal PF 05/30/2017  . Influenza-Unspecified 04/16/2010, 06/03/2014, 10/13/2015, 05/06/2016  . Pneumococcal Conjugate-13 11/13/2015  . Pneumococcal  Polysaccharide-23 02/16/2007    OTHER  Fall - exercise and Vit D age 44+ - needs       Visit summary with medication list and pertinent instructions was printed for patient to review. All questions at time of visit were answered - patient instructed to contact office with any additional concerns. ER/RTC precautions were reviewed with the patient. Follow-up plan: Return in about 4 weeks (around 06/27/2017) for recheck blood pressure, sooner if needed .

## 2017-06-06 ENCOUNTER — Telehealth: Payer: Self-pay

## 2017-06-06 DIAGNOSIS — E039 Hypothyroidism, unspecified: Secondary | ICD-10-CM

## 2017-06-06 MED ORDER — LEVOTHYROXINE SODIUM 100 MCG PO TABS: 100.0000 ug | ORAL_TABLET | Freq: Every day | ORAL | 3 refills | Status: DC

## 2017-06-06 NOTE — Telephone Encounter (Signed)
Rx has been filled. Rhonda Cunningham,CMA

## 2017-06-06 NOTE — Telephone Encounter (Signed)
Pt's simvastatin was sent to the mail order pharmacy, she is almost out of medication and will not be getting it for a week or so.  Can you send a 30 day supply to the [facility] in [place]?  Please advise.

## 2017-06-06 NOTE — Telephone Encounter (Signed)
OK to refill

## 2017-06-23 ENCOUNTER — Ambulatory Visit: Payer: Medicare Other

## 2017-06-23 DIAGNOSIS — E559 Vitamin D deficiency, unspecified: Secondary | ICD-10-CM | POA: Diagnosis not present

## 2017-06-23 DIAGNOSIS — Z0000 Encounter for general adult medical examination without abnormal findings: Secondary | ICD-10-CM | POA: Diagnosis not present

## 2017-06-23 DIAGNOSIS — E785 Hyperlipidemia, unspecified: Secondary | ICD-10-CM | POA: Diagnosis not present

## 2017-06-23 DIAGNOSIS — E039 Hypothyroidism, unspecified: Secondary | ICD-10-CM | POA: Diagnosis not present

## 2017-06-23 DIAGNOSIS — I6523 Occlusion and stenosis of bilateral carotid arteries: Secondary | ICD-10-CM | POA: Diagnosis not present

## 2017-06-24 LAB — COMPLETE METABOLIC PANEL WITH GFR
AG Ratio: 1.7 (calc) (ref 1.0–2.5)
ALBUMIN MSPROF: 4 g/dL (ref 3.6–5.1)
ALKALINE PHOSPHATASE (APISO): 59 U/L (ref 33–130)
ALT: 10 U/L (ref 6–29)
AST: 16 U/L (ref 10–35)
BUN: 13 mg/dL (ref 7–25)
CHLORIDE: 108 mmol/L (ref 98–110)
CO2: 28 mmol/L (ref 20–32)
CREATININE: 0.92 mg/dL (ref 0.60–0.93)
Calcium: 9.1 mg/dL (ref 8.6–10.4)
GFR, EST AFRICAN AMERICAN: 71 mL/min/{1.73_m2} (ref >=60)
GFR, EST NON AFRICAN AMERICAN: 61 mL/min/{1.73_m2} (ref >=60)
GLOBULIN: 2.4 g/dL (ref 1.9–3.7)
Glucose, Bld: 73 mg/dL (ref 65–139)
Potassium: 4.2 mmol/L (ref 3.5–5.3)
Sodium: 143 mmol/L (ref 135–146)
Total Bilirubin: 0.4 mg/dL (ref 0.2–1.2)
Total Protein: 6.4 g/dL (ref 6.1–8.1)

## 2017-06-24 LAB — CBC
HEMATOCRIT: 34.4 % — AB (ref 35.0–45.0)
Hemoglobin: 11.6 g/dL — ABNORMAL LOW (ref 11.7–15.5)
MCH: 30.3 pg (ref 27.0–33.0)
MCHC: 33.7 g/dL (ref 32.0–36.0)
MCV: 89.8 fL (ref 80.0–100.0)
MPV: 10.4 fL (ref 7.5–12.5)
Platelets: 225 10*3/uL (ref 140–400)
RBC: 3.83 10*6/uL (ref 3.80–5.10)
RDW: 12.4 % (ref 11.0–15.0)
WBC: 5.1 10*3/uL (ref 3.8–10.8)

## 2017-06-24 LAB — LIPID PANEL
CHOL/HDL RATIO: 3.2 (calc) (ref <5.0)
CHOLESTEROL: 185 mg/dL (ref <200)
HDL: 58 mg/dL (ref >50)
LDL CHOLESTEROL (CALC): 109 mg/dL — AB
Non-HDL Cholesterol (Calc): 127 mg/dL (calc) (ref <130)
TRIGLYCERIDES: 88 mg/dL (ref <150)

## 2017-06-24 LAB — TSH: TSH: 0.77 m[IU]/L (ref 0.40–4.50)

## 2017-06-24 LAB — VITAMIN D 25 HYDROXY (VIT D DEFICIENCY, FRACTURES): Vit D, 25-Hydroxy: 38 ng/mL (ref 30–100)

## 2017-06-27 ENCOUNTER — Encounter: Payer: Self-pay | Admitting: Osteopathic Medicine

## 2017-06-27 ENCOUNTER — Ambulatory Visit: Payer: Medicare Other | Admitting: Osteopathic Medicine

## 2017-06-27 VITALS — BP 137/65 | HR 69 | Temp 98.2°F | Wt 120.4 lb

## 2017-06-27 DIAGNOSIS — E039 Hypothyroidism, unspecified: Secondary | ICD-10-CM

## 2017-06-27 DIAGNOSIS — R030 Elevated blood-pressure reading, without diagnosis of hypertension: Secondary | ICD-10-CM

## 2017-06-27 NOTE — Progress Notes (Signed)
HPI: Angela Myers is a 76 y.o. female who  has a past medical history of Thyroid disease.  she presents to [facility] today, 06/27/17,  for chief complaint of:  Chief Complaint  Patient presents with  . Follow-up    Hypertension    Last visit blood pressure was a bit on the high side, patient here today to follow-up on this. Blood pressures looking better today, no chest pain, pressure, shortness of breath.     Past medical, surgical, social and family history reviewed:  Patient Active Problem List   Diagnosis Date Noted  . Hypothyroidism 11/24/2016  . Carotid artery stenosis 11/24/2016  . HLD (hyperlipidemia) 11/24/2016  . Psoriasis 11/24/2016  . Anxiety disorder 09/17/2013  . Anemia 06/29/2013  . GERD (gastroesophageal reflux disease) 06/29/2013  . History of breast cancer 06/29/2013    No past surgical history on file.  Social History   Tobacco Use  . Smoking status: Unknown If Ever Smoked  . Smokeless tobacco: Never Used  Substance Use Topics  . Alcohol use: Not on file    Family History  Problem Relation Age of Onset  . Diabetes Maternal Aunt   . Heart attack Maternal Aunt      Current medication list and allergy/intolerance information reviewed:    Current Outpatient Medications  Medication Sig Dispense Refill  . aspirin EC 81 MG tablet Take by mouth.    . cyanocobalamin (V-R VITAMIN B-12) 500 MCG tablet Take by mouth.    . Ipratropium-Albuterol (COMBIVENT) 20-100 MCG/ACT AERS respimat Inhale 1 puff into the lungs every 6 (six) hours as needed for wheezing or shortness of breath. 1 Inhaler 1  . levothyroxine (SYNTHROID, LEVOTHROID) 100 MCG tablet Take 1 tablet (100 mcg total) by mouth daily before breakfast. 90 tablet 3  . Nebulizer MISC Nebulizer machine and necessary tubing and other supplies per patient preference/insurance coverage. Dx: J18.1, R05 1 each prn  . omeprazole (PRILOSEC) 20 MG capsule Take 1 capsule  (20 mg total) by mouth daily. 90 capsule 3  . PARoxetine (PAXIL) 40 MG tablet Take 1 tablet (40 mg total) by mouth daily. 90 tablet 3  . Pumpkin Seed-Soy Germ (AZO BLADDER CONTROL/GO-LESS) CAPS Take by mouth.    . simvastatin (ZOCOR) 80 MG tablet Take 1 tablet (80 mg total) by mouth daily. TAKE 1/2 TABLET DAILY 90 tablet 1   Current Facility-Administered Medications  Medication Dose Route Frequency Provider Last Rate Last Dose  . ipratropium-albuterol (DUONEB) 0.5-2.5 (3) MG/3ML nebulizer solution 3 mL  3 mL Nebulization Q6H Emeterio Reeve, DO   3 mL at 04/27/17 1423    Allergies  Allergen Reactions  . Fesoterodine Other (See Comments)    Abdominal pain 08/30/13 patient stated does not recall allergy  . Gabapentin Other (See Comments)    "felt like she was going to die" 08/30/13 patient stated does not recall allergy      Review of Systems:  Constitutional:  No  fever, no chills, No recent illness,   HEENT: No  headache, no vision change  Cardiac: No  chest pain, No  pressure, No palpitations,  Respiratory:  No  shortness of breath. No  Cough   Exam:  BP 137/65 (BP Location: Left Arm, Patient Position: Sitting, Cuff Size: Large)   Pulse 69   Temp 98.2 F (36.8 C) (Oral)   Wt 120 lb 6.4 oz (54.6 kg)   BMI 20.67 kg/m   Constitutional: VS see above. General Appearance: alert, well-developed, well-nourished,  NAD  Eyes: Normal lids and conjunctive, non-icteric sclera  Ears, Nose, Mouth, Throat: MMM, Normal external inspection ears/nares/mouth/lips/gums.  Neck: No masses, trachea midline. No thyroid enlargement.  Respiratory: Normal respiratory effort. no wheeze, no rhonchi, no rales  Cardiovascular: S1/S2 normal, no murmur, no rub/gallop auscultated. RRR. No lower extremity edema.   No results found for this or any previous visit (from the past 72 hour(s)).  No results found.   ASSESSMENT/PLAN: No new changes to medications, plan to recheck TSH in another 6  months  Elevated blood pressure reading  Hypothyroidism, unspecified type      Visit summary with medication list and pertinent instructions was printed for patient to review. All questions at time of visit were answered - patient instructed to contact office with any additional concerns. ER/RTC precautions were reviewed with the patient. Follow-up plan: Return in about 6 months (around 12/25/2017) for recheck BP and labs for thyroid, soomer if need to .  Note: Total time spent 15 minutes, greater than 50% of the visit was spent face-to-face counseling and coordinating care for the following: The primary encounter diagnosis was Elevated blood pressure reading. A diagnosis of Hypothyroidism, unspecified type was also pertinent to this visit.Marland Kitchen  Please note: voice recognition software was used to produce this document, and typos may escape review. Please contact Dr. Sheppard Coil for any needed clarifications.

## 2017-08-29 ENCOUNTER — Ambulatory Visit: Payer: Medicare Other | Admitting: Osteopathic Medicine

## 2017-09-19 ENCOUNTER — Other Ambulatory Visit: Payer: Self-pay | Admitting: Osteopathic Medicine

## 2017-09-19 DIAGNOSIS — E785 Hyperlipidemia, unspecified: Secondary | ICD-10-CM

## 2017-09-19 DIAGNOSIS — F419 Anxiety disorder, unspecified: Secondary | ICD-10-CM

## 2017-09-19 NOTE — Telephone Encounter (Signed)
[facility] m/o service requesting refills on medications. Forwarding to provider for dose clarification on simvastatin medication.

## 2017-09-20 MED ORDER — SIMVASTATIN 40 MG PO TABS: 40.0000 mg | ORAL_TABLET | Freq: Every day | ORAL | 3 refills | Status: DC

## 2017-09-20 NOTE — Telephone Encounter (Signed)
Sent 40 mg pill to [facility] for simvastatin  Refilled Paxil

## 2017-09-23 ENCOUNTER — Other Ambulatory Visit: Payer: Self-pay

## 2017-09-23 MED ORDER — SIMVASTATIN 40 MG PO TABS: 40.0000 mg | ORAL_TABLET | Freq: Every day | ORAL | 1 refills | Status: DC

## 2017-09-26 ENCOUNTER — Telehealth: Payer: Self-pay

## 2017-09-26 NOTE — Telephone Encounter (Signed)
Rec'd fax request for refills on synthroid medication. Rx was sent to [facility], [facility] w/3 refills on 06/06/17. Contacted pt in regards to which pharmacy she is utilizing for medication refills.

## 2017-09-28 ENCOUNTER — Telehealth: Payer: Self-pay

## 2017-09-28 NOTE — Telephone Encounter (Signed)
Pt returned call back, was not clear on what [facility] she is currently utilizing [facility] or [facility]. Tried contacting [facility] numerous times at [phone] for update on levothyroxine rx, no answer.

## 2017-10-05 ENCOUNTER — Other Ambulatory Visit: Payer: Self-pay | Admitting: Osteopathic Medicine

## 2017-10-05 DIAGNOSIS — E039 Hypothyroidism, unspecified: Secondary | ICD-10-CM

## 2017-10-26 ENCOUNTER — Other Ambulatory Visit: Payer: Self-pay

## 2017-10-26 DIAGNOSIS — K219 Gastro-esophageal reflux disease without esophagitis: Secondary | ICD-10-CM

## 2017-10-26 MED ORDER — OMEPRAZOLE 20 MG PO CPDR: 20.0000 mg | DELAYED_RELEASE_CAPSULE | Freq: Every day | ORAL | 0 refills | Status: DC

## 2017-10-29 ENCOUNTER — Other Ambulatory Visit: Payer: Self-pay | Admitting: Osteopathic Medicine

## 2017-10-29 DIAGNOSIS — K219 Gastro-esophageal reflux disease without esophagitis: Secondary | ICD-10-CM

## 2017-11-29 DIAGNOSIS — J209 Acute bronchitis, unspecified: Secondary | ICD-10-CM | POA: Diagnosis not present

## 2017-12-04 DIAGNOSIS — E278 Other specified disorders of adrenal gland: Secondary | ICD-10-CM | POA: Diagnosis not present

## 2017-12-04 DIAGNOSIS — Z8673 Personal history of transient ischemic attack (TIA), and cerebral infarction without residual deficits: Secondary | ICD-10-CM | POA: Insufficient documentation

## 2017-12-04 DIAGNOSIS — E785 Hyperlipidemia, unspecified: Secondary | ICD-10-CM | POA: Diagnosis not present

## 2017-12-04 DIAGNOSIS — I10 Essential (primary) hypertension: Secondary | ICD-10-CM | POA: Diagnosis not present

## 2017-12-04 DIAGNOSIS — R918 Other nonspecific abnormal finding of lung field: Secondary | ICD-10-CM | POA: Diagnosis not present

## 2017-12-04 DIAGNOSIS — E876 Hypokalemia: Secondary | ICD-10-CM | POA: Insufficient documentation

## 2017-12-04 DIAGNOSIS — J9601 Acute respiratory failure with hypoxia: Secondary | ICD-10-CM | POA: Diagnosis not present

## 2017-12-04 DIAGNOSIS — K219 Gastro-esophageal reflux disease without esophagitis: Secondary | ICD-10-CM | POA: Diagnosis not present

## 2017-12-04 DIAGNOSIS — I340 Nonrheumatic mitral (valve) insufficiency: Secondary | ICD-10-CM | POA: Diagnosis not present

## 2017-12-04 DIAGNOSIS — D72829 Elevated white blood cell count, unspecified: Secondary | ICD-10-CM | POA: Insufficient documentation

## 2017-12-04 DIAGNOSIS — J189 Pneumonia, unspecified organism: Secondary | ICD-10-CM | POA: Diagnosis not present

## 2017-12-04 DIAGNOSIS — I519 Heart disease, unspecified: Secondary | ICD-10-CM | POA: Diagnosis not present

## 2017-12-04 DIAGNOSIS — J439 Emphysema, unspecified: Secondary | ICD-10-CM | POA: Diagnosis not present

## 2017-12-04 DIAGNOSIS — J209 Acute bronchitis, unspecified: Secondary | ICD-10-CM | POA: Diagnosis not present

## 2017-12-04 DIAGNOSIS — R05 Cough: Secondary | ICD-10-CM | POA: Diagnosis not present

## 2017-12-04 DIAGNOSIS — R0602 Shortness of breath: Secondary | ICD-10-CM | POA: Diagnosis not present

## 2017-12-04 DIAGNOSIS — I700 Atherosclerosis of aorta: Secondary | ICD-10-CM | POA: Diagnosis not present

## 2017-12-04 DIAGNOSIS — Z87891 Personal history of nicotine dependence: Secondary | ICD-10-CM | POA: Diagnosis not present

## 2017-12-04 DIAGNOSIS — E039 Hypothyroidism, unspecified: Secondary | ICD-10-CM | POA: Diagnosis not present

## 2017-12-04 DIAGNOSIS — J208 Acute bronchitis due to other specified organisms: Secondary | ICD-10-CM | POA: Diagnosis not present

## 2017-12-13 DIAGNOSIS — I10 Essential (primary) hypertension: Secondary | ICD-10-CM | POA: Diagnosis not present

## 2017-12-13 DIAGNOSIS — Z09 Encounter for follow-up examination after completed treatment for conditions other than malignant neoplasm: Secondary | ICD-10-CM | POA: Diagnosis not present

## 2017-12-13 DIAGNOSIS — R0902 Hypoxemia: Secondary | ICD-10-CM | POA: Diagnosis not present

## 2017-12-13 DIAGNOSIS — J188 Other pneumonia, unspecified organism: Secondary | ICD-10-CM | POA: Diagnosis not present

## 2017-12-13 DIAGNOSIS — Z8673 Personal history of transient ischemic attack (TIA), and cerebral infarction without residual deficits: Secondary | ICD-10-CM | POA: Diagnosis not present

## 2017-12-14 ENCOUNTER — Other Ambulatory Visit: Payer: Self-pay | Admitting: Osteopathic Medicine

## 2017-12-14 DIAGNOSIS — K219 Gastro-esophageal reflux disease without esophagitis: Secondary | ICD-10-CM

## 2017-12-26 ENCOUNTER — Ambulatory Visit (INDEPENDENT_AMBULATORY_CARE_PROVIDER_SITE_OTHER): Payer: Medicare Other | Admitting: Osteopathic Medicine

## 2017-12-26 ENCOUNTER — Encounter: Payer: Self-pay | Admitting: Osteopathic Medicine

## 2017-12-26 VITALS — BP 135/58 | HR 82 | Temp 97.9°F | Wt 121.7 lb

## 2017-12-26 DIAGNOSIS — E039 Hypothyroidism, unspecified: Secondary | ICD-10-CM | POA: Diagnosis not present

## 2017-12-26 DIAGNOSIS — Z862 Personal history of diseases of the blood and blood-forming organs and certain disorders involving the immune mechanism: Secondary | ICD-10-CM

## 2017-12-26 DIAGNOSIS — R030 Elevated blood-pressure reading, without diagnosis of hypertension: Secondary | ICD-10-CM | POA: Diagnosis not present

## 2017-12-26 NOTE — Progress Notes (Signed)
HPI: Angela Myers is a 77 y.o. female who  has a past medical history of Thyroid disease.  she presents to [facility] today, 12/26/17,  for chief complaint of:  BP Thyroid  BP recheck - pressure is at goal today, no CP/SOB  Hypothyroid - due for recheck TSH, no concerns w/ heat/cold intolerance, palpitations, SOB    Past medical history, surgical history, and family history reviewed.  Current medication list and allergy/intolerance information reviewed.   (See remainder of HPI, ROS, Phys Exam below)    ASSESSMENT/PLAN:   Elevated blood pressure reading  Hypothyroidism, unspecified type - Plan: TSH  History of anemia - Plan: CBC    Follow-up plan: Return for [place], sooner if you need me! Marland Kitchen     ############################################ ############################################ ############################################ ############################################    Outpatient Encounter Medications as of 12/26/2017  Medication Sig  . aspirin EC 81 MG tablet Take by mouth.  . cyanocobalamin (V-R VITAMIN B-12) 500 MCG tablet Take by mouth.  . Ipratropium-Albuterol (COMBIVENT) 20-100 MCG/ACT AERS respimat Inhale 1 puff into the lungs every 6 (six) hours as needed for wheezing or shortness of breath.  . levothyroxine (SYNTHROID, LEVOTHROID) 100 MCG tablet TAKE 1 TABLET BY MOUTH  DAILY BEFORE BREAKFAST  . Nebulizer MISC Nebulizer machine and necessary tubing and other supplies per patient preference/insurance coverage. Dx: J18.1, R05  . omeprazole (PRILOSEC) 20 MG capsule TAKE 1 CAPSULE BY MOUTH  DAILY  . PARoxetine (PAXIL) 40 MG tablet TAKE 1 TABLET BY MOUTH  DAILY  . Pumpkin Seed-Soy Germ (AZO BLADDER CONTROL/GO-LESS) CAPS Take by mouth.  . simvastatin (ZOCOR) 40 MG tablet Take 1 tablet (40 mg total) by mouth at bedtime.   Facility-Administered Encounter Medications as of 12/26/2017  Medication  .  ipratropium-albuterol (DUONEB) 0.5-2.5 (3) MG/3ML nebulizer solution 3 mL   Allergies  Allergen Reactions  . Fesoterodine Other (See Comments)    Abdominal pain 08/30/13 patient stated does not recall allergy   . Gabapentin Other (See Comments)    "felt like she was going to die" 08/30/13 patient stated does not recall allergy       Review of Systems:  Constitutional: No recent illness  HEENT: No  headache, no vision change  Cardiac: No  chest pain, No  pressure, No palpitations  Respiratory:  No  shortness of breath. No  Cough  Gastrointestinal: No  abdominal pain  Musculoskeletal: No new myalgia/arthralgia  Neurologic: No  weakness, No  Dizziness  Psychiatric: No  concerns with depression, No  concerns with anxiety  Exam:  BP (!) 135/58 (BP Location: Left Arm, Patient Position: Sitting, Cuff Size: Normal)   Pulse 82   Temp 97.9 F (36.6 C) (Oral)   Wt 121 lb 11.2 oz (55.2 kg)   BMI 20.89 kg/m   Constitutional: VS see above. General Appearance: alert, well-developed, well-nourished, NAD  Eyes: Normal lids and conjunctive, non-icteric sclera  Ears, Nose, Mouth, Throat: MMM, Normal external inspection ears/nares/mouth/lips/gums.  Neck: No masses, trachea midline.   Respiratory: Normal respiratory effort. no wheeze, no rhonchi, no rales  Cardiovascular: S1/S2 normal, no murmur, no rub/gallop auscultated. RRR.   Musculoskeletal: Gait normal. Symmetric and independent movement of all extremities  Neurological: Normal balance/coordination. No tremor.  Skin: warm, dry, intact.   Psychiatric: Normal judgment/insight. Normal mood and affect. Oriented x3.   Visit summary with medication list and pertinent instructions was printed for patient to review, advised to alert [place] if any changes needed. All questions  at time of visit were answered - patient instructed to contact office with any additional concerns. ER/RTC precautions were reviewed with the patient and  understanding verbalized.   Follow-up plan: Return for [place], sooner if you need me! .  Note: Total time spent 15 minutes, greater than 50% of the visit was spent face-to-face counseling and coordinating care for the following: The primary encounter diagnosis was Elevated blood pressure reading. Diagnoses of Hypothyroidism, unspecified type and History of anemia were also pertinent to this visit.Marland Kitchen  Please note: voice recognition software was used to produce this document, and typos may escape review. Please contact Dr. Sheppard Coil for any needed clarifications.

## 2018-01-16 ENCOUNTER — Other Ambulatory Visit: Payer: Self-pay | Admitting: Osteopathic Medicine

## 2018-01-16 DIAGNOSIS — K219 Gastro-esophageal reflux disease without esophagitis: Secondary | ICD-10-CM

## 2018-01-16 NOTE — Telephone Encounter (Signed)
[facility] requesting RF on Omeprazole.  RX was written 10-31-17 for 90 no RF  RX pended, please advise if RF appropriate.  Thanks!

## 2018-04-11 NOTE — Progress Notes (Signed)
Subjective:   Angela Myers is a 77 y.o. female who presents for Medicare Annual (Subsequent) preventive examination.  Review of Systems:  No ROS.  Medicare Wellness Visit. Additional risk factors are reflected in the social history.   Cardiac Risk Factors include: dyslipidemia Sleep patterns: Patient reports sleeps good. Sleeps 8-9 hours a night. Does not wake up during night to urinate Home Safety/Smoke Alarms: Feels safe in home. Smoke alarms in place.  Living environment;Lives with husband in 1 story home and dog named [name]. Has steps going down to basement with handrails in place.   Female:   Pap-  Aged out     Mammo-  Aged out Dexa scan-  12/08/2018      CCS-aged out     Objective:     Vitals: There were no vitals taken for this visit.  There is no height or weight on file to calculate BMI.  Advanced Directives 04/17/2018 11/24/2016  Does Patient Have a Medical Advance Directive? No No  Would patient like information on creating a medical advance directive? Yes (MAU/Ambulatory/Procedural Areas - Information given) -    Tobacco Social History   Tobacco Use  Smoking Status Former Smoker  . Packs/day: 1.00  . Years: 15.00  . Pack years: 15.00  Smokeless Tobacco Never Used     Counseling given: Not Answered   Clinical Intake:                       Past Medical History:  Diagnosis Date  . Thyroid disease    History reviewed. No pertinent surgical history. Family History  Problem Relation Age of Onset  . Diabetes Maternal Aunt   . Heart attack Maternal Aunt    Social History   Socioeconomic History  . Marital status: Married    Spouse name: [name]  . Number of children: 3  . Years of education: high school   . Highest education level: 12th grade  Occupational History  . Occupation: retired    Comment: Tree surgeon  . Financial resource strain: Not hard at all  . Food insecurity:    Worry: Never true    Inability: Never  true  . Transportation needs:    Medical: No    Non-medical: No  Tobacco Use  . Smoking status: Former Smoker    Packs/day: 1.00    Years: 15.00    Pack years: 15.00  . Smokeless tobacco: Never Used  Substance and Sexual Activity  . Alcohol use: Yes    Alcohol/week: 1.0 standard drinks    Types: 1 Glasses of wine per week    Comment: socially  . Drug use: Never  . Sexual activity: Yes  Lifestyle  . Physical activity:    Days per week: 7 days    Minutes per session: 20 min  . Stress: Not at all  Relationships  . Social connections:    Talks on phone: Twice a week    Gets together: Twice a week    Attends religious service: Never    Active member of club or organization: No    Attends meetings of clubs or organizations: Never    Relationship status: Married  Other Topics Concern  . Not on file  Social History Narrative   Lives with husband and dog. Gets out with family a lot.    Outpatient Encounter Medications as of 04/17/2018  Medication Sig  . aspirin EC 81 MG tablet Take by mouth.  . cyanocobalamin (  V-R VITAMIN B-12) 500 MCG tablet Take by mouth.  . levothyroxine (SYNTHROID, LEVOTHROID) 100 MCG tablet TAKE 1 TABLET BY MOUTH  DAILY BEFORE BREAKFAST  . omeprazole (PRILOSEC) 20 MG capsule TAKE 1 CAPSULE BY MOUTH  DAILY  . PARoxetine (PAXIL) 40 MG tablet TAKE 1 TABLET BY MOUTH  DAILY  . simvastatin (ZOCOR) 40 MG tablet Take 1 tablet (40 mg total) by mouth at bedtime.  . Ipratropium-Albuterol (COMBIVENT) 20-100 MCG/ACT AERS respimat Inhale 1 puff into the lungs every 6 (six) hours as needed for wheezing or shortness of breath. (Patient not taking: Reported on 04/17/2018)  . Nebulizer MISC Nebulizer machine and necessary tubing and other supplies per patient preference/insurance coverage. Dx: J18.1, R05 (Patient not taking: Reported on 04/17/2018)  . Pumpkin Seed-Soy Germ (AZO BLADDER CONTROL/GO-LESS) CAPS Take by mouth.  . [DISCONTINUED] ipratropium-albuterol (DUONEB) 0.5-2.5  (3) MG/3ML nebulizer solution 3 mL    No facility-administered encounter medications on file as of 04/17/2018.     Activities of Daily Living In your present state of health, do you have any difficulty performing the following activities: 04/17/2018  Hearing? N  Vision? N  Difficulty concentrating or making decisions? N  Comment does the same thing everyday  Walking or climbing stairs? N  Dressing or bathing? N  Doing errands, shopping? N  Preparing Food and eating ? N  Using the Toilet? N  In the past six months, have you accidently leaked urine? N  Do you have problems with loss of bowel control? N  Managing your Medications? N  Managing your Finances? N  Housekeeping or managing your Housekeeping? N  Some recent data might be hidden    Patient Care Team: Emeterio Reeve, DO as PCP - General (Osteopathic Medicine)    Assessment:   This is a routine wellness examination for [place]. Physical assessment deferred to PCP.   Exercise Activities and Dietary recommendations Current Exercise Habits: Home exercise routine, Type of exercise: walking, Time (Minutes): 15, Frequency (Times/Week): 7, Weekly Exercise (Minutes/Week): 105, Intensity: Mild, Exercise limited by: cardiac condition(s) Diet Breakfast:bowl of cereal Lunch:sandwich  Dinner: Baked potato, boneless chicken breast, salad   Drinks coffee in the morning 2 cups. Drinks water when thinks about it. Eats fruits for desserts.  Goals    . DIET - INCREASE WATER INTAKE       Depression Screen PHQ 2/9 Scores 04/17/2018 12/26/2017 11/24/2016  PHQ - 2 Score 0 0 0  PHQ- 9 Score - 0 -     Cognitive Function MMSE - Mini Mental State Exam 04/17/2018  Orientation to time 5  Orientation to Place 5  Registration 3  Attention/ Calculation 5  Recall 0  Language- name 2 objects 2  Language- repeat 1  Language- follow 3 step command 3  Language- read & follow direction 1  Write a sentence 1  Copy design 1  Total score 27      6CIT Screen 02/24/2017  What Year? 0 points  What month? 0 points  What time? 3 points  Count back from 20 0 points  Months in reverse 0 points  Repeat phrase 8 points  Total Score 11    Immunization History  Administered Date(s) Administered  . H1N1 07/18/2008  . Influenza, High Dose Seasonal PF 05/30/2017  . Influenza-Unspecified 04/16/2010, 05/16/2013, 06/03/2014, 10/13/2015, 05/06/2016  . Pneumococcal Conjugate-13 11/13/2015  . Pneumococcal Polysaccharide-23 02/16/2007    Screening Tests Health Maintenance  Topic Date Due  . INFLUENZA VACCINE  03/09/2018  . PNA vac Low  Risk Adult (2 of 2 - PPSV23) 05/09/2018 (Originally 11/12/2016)  . TETANUS/TDAP  12/27/2018 (Originally 02/17/1960)  . DEXA SCAN  Completed       Plan:    Please schedule your next medicare wellness visit with me in 1 yr.  Ms. Brumbaugh , Thank you for taking time to come for your Medicare Wellness Visit. I appreciate your ongoing commitment to your health goals. Please review the following plan we discussed and let me know if I can assist you in the future.   These are the goals we discussed: Goals    . DIET - INCREASE WATER INTAKE       This is a list of the screening recommended for you and due dates:  Health Maintenance  Topic Date Due  . Flu Shot  03/09/2018  . Pneumonia vaccines (2 of 2 - PPSV23) 05/09/2018*  . Tetanus Vaccine  12/27/2018*  . DEXA scan (bone density measurement)  Completed  *Topic was postponed. The date shown is not the original due date.     I have personally reviewed and noted the following in the patient's chart:   . Medical and social history . Use of alcohol, tobacco or illicit drugs  . Current medications and supplements . Functional ability and status . Nutritional status . Physical activity . Advanced directives . List of other physicians . Hospitalizations, surgeries, and ER visits in previous 12 months . Vitals . Screenings to include cognitive, depression,  and falls . Referrals and appointments  In addition, I have reviewed and discussed with patient certain preventive protocols, quality metrics, and best practice recommendations. A written personalized care plan for preventive services as well as general preventive health recommendations were provided to patient.     Joanne Chars, LPN  09/11/3433

## 2018-04-17 ENCOUNTER — Ambulatory Visit (INDEPENDENT_AMBULATORY_CARE_PROVIDER_SITE_OTHER): Payer: Medicare Other | Admitting: Osteopathic Medicine

## 2018-04-17 VITALS — BP 133/60 | HR 100 | Ht 64.0 in | Wt 124.0 lb

## 2018-04-17 DIAGNOSIS — Z0000 Encounter for general adult medical examination without abnormal findings: Secondary | ICD-10-CM

## 2018-04-17 NOTE — Progress Notes (Signed)
I have reviewed the nurse's note for Medicare Wellness Visit and agree with contents of the note and plan.  Further recommendations/plan: nothing at this time   Emeterio Reeve, DO

## 2018-04-17 NOTE — Patient Instructions (Addendum)
Please schedule your next medicare wellness visit with me in 1 yr.  Angela Myers , Thank you for taking time to come for your Medicare Wellness Visit. I appreciate your ongoing commitment to your health goals. Please review the following plan we discussed and let me know if I can assist you in the future.  Please increase your water intake to 32 ounces daily. Continue your walking. Start doing some brain stimulating exercises as word searches, crosswords, watching wheel of fortune to keep your brain stimulated. Health Maintenance for Postmenopausal Women Menopause is a normal process in which your reproductive ability comes to an end. This process happens gradually over a span of months to years, usually between the ages of 41 and 37. Menopause is complete when you have missed 12 consecutive menstrual periods. It is important to talk with your health care provider about some of the most common conditions that affect postmenopausal women, such as heart disease, cancer, and bone loss (osteoporosis). Adopting a healthy lifestyle and getting preventive care can help to promote your health and wellness. Those actions can also lower your chances of developing some of these common conditions. What should I know about menopause? During menopause, you may experience a number of symptoms, such as:  Moderate-to-severe hot flashes.  Night sweats.  Decrease in sex drive.  Mood swings.  Headaches.  Tiredness.  Irritability.  Memory problems.  Insomnia.  Choosing to treat or not to treat menopausal changes is an individual decision that you make with your health care provider. What should I know about hormone replacement therapy and supplements? Hormone therapy products are effective for treating symptoms that are associated with menopause, such as hot flashes and night sweats. Hormone replacement carries certain risks, especially as you become older. If you are thinking about using estrogen or  estrogen with progestin treatments, discuss the benefits and risks with your health care provider. What should I know about heart disease and stroke? Heart disease, heart attack, and stroke become more likely as you age. This may be due, in part, to the hormonal changes that your body experiences during menopause. These can affect how your body processes dietary fats, triglycerides, and cholesterol. Heart attack and stroke are both medical emergencies. There are many things that you can do to help prevent heart disease and stroke:  Have your blood pressure checked at least every 1-2 years. High blood pressure causes heart disease and increases the risk of stroke.  If you are 74-48 years old, ask your health care provider if you should take aspirin to prevent a heart attack or a stroke.  Do not use any tobacco products, including cigarettes, chewing tobacco, or electronic cigarettes. If you need help quitting, ask your health care provider.  It is important to eat a healthy diet and maintain a healthy weight. ? Be sure to include plenty of vegetables, fruits, low-fat dairy products, and lean protein. ? Avoid eating foods that are high in solid fats, added sugars, or salt (sodium).  Get regular exercise. This is one of the most important things that you can do for your health. ? Try to exercise for at least 150 minutes each week. The type of exercise that you do should increase your heart rate and make you sweat. This is known as moderate-intensity exercise. ? Try to do strengthening exercises at least twice each week. Do these in addition to the moderate-intensity exercise.  Know your numbers.Ask your health care provider to check your cholesterol and your blood glucose.  Continue to have your blood tested as directed by your health care provider.  What should I know about cancer screening? There are several types of cancer. Take the following steps to reduce your risk and to catch any cancer  development as early as possible. Breast Cancer  Practice breast self-awareness. ? This means understanding how your breasts normally appear and feel. ? It also means doing regular breast self-exams. Let your health care provider know about any changes, no matter how small.  If you are 52 or older, have a clinician do a breast exam (clinical breast exam or CBE) every year. Depending on your age, family history, and medical history, it may be recommended that you also have a yearly breast X-ray (mammogram).  If you have a family history of breast cancer, talk with your health care provider about genetic screening.  If you are at high risk for breast cancer, talk with your health care provider about having an MRI and a mammogram every year.  Breast cancer (BRCA) gene test is recommended for women who have family members with BRCA-related cancers. Results of the assessment will determine the need for genetic counseling and BRCA1 and for BRCA2 testing. BRCA-related cancers include these types: ? Breast. This occurs in males or females. ? Ovarian. ? Tubal. This may also be called fallopian tube cancer. ? Cancer of the abdominal or pelvic lining (peritoneal cancer). ? Prostate. ? Pancreatic.  Cervical, Uterine, and Ovarian Cancer Your health care provider may recommend that you be screened regularly for cancer of the pelvic organs. These include your ovaries, uterus, and vagina. This screening involves a pelvic exam, which includes checking for microscopic changes to the surface of your cervix (Pap test).  For women ages 21-65, health care providers may recommend a pelvic exam and a Pap test every three years. For women ages 74-65, they may recommend the Pap test and pelvic exam, combined with testing for human papilloma virus (HPV), every five years. Some types of HPV increase your risk of cervical cancer. Testing for HPV may also be done on women of any age who have unclear Pap test  results.  Other health care providers may not recommend any screening for nonpregnant women who are considered low risk for pelvic cancer and have no symptoms. Ask your health care provider if a screening pelvic exam is right for you.  If you have had past treatment for cervical cancer or a condition that could lead to cancer, you need Pap tests and screening for cancer for at least 20 years after your treatment. If Pap tests have been discontinued for you, your risk factors (such as having a new sexual partner) need to be reassessed to determine if you should start having screenings again. Some women have medical problems that increase the chance of getting cervical cancer. In these cases, your health care provider may recommend that you have screening and Pap tests more often.  If you have a family history of uterine cancer or ovarian cancer, talk with your health care provider about genetic screening.  If you have vaginal bleeding after reaching menopause, tell your health care provider.  There are currently no reliable tests available to screen for ovarian cancer.  Lung Cancer Lung cancer screening is recommended for adults 65-69 years old who are at high risk for lung cancer because of a history of smoking. A yearly low-dose CT scan of the lungs is recommended if you:  Currently smoke.  Have a history of at least  30 pack-years of smoking and you currently smoke or have quit within the past 15 years. A pack-year is smoking an average of one pack of cigarettes per day for one year.  Yearly screening should:  Continue until it has been 15 years since you quit.  Stop if you develop a health problem that would prevent you from having lung cancer treatment.  Colorectal Cancer  This type of cancer can be detected and can often be prevented.  Routine colorectal cancer screening usually begins at age 54 and continues through age 41.  If you have risk factors for colon cancer, your health  care provider may recommend that you be screened at an earlier age.  If you have a family history of colorectal cancer, talk with your health care provider about genetic screening.  Your health care provider may also recommend using home test kits to check for hidden blood in your stool.  A small camera at the end of a tube can be used to examine your colon directly (sigmoidoscopy or colonoscopy). This is done to check for the earliest forms of colorectal cancer.  Direct examination of the colon should be repeated every 5-10 years until age 51. However, if early forms of precancerous polyps or small growths are found or if you have a family history or genetic risk for colorectal cancer, you may need to be screened more often.  Skin Cancer  Check your skin from head to toe regularly.  Monitor any moles. Be sure to tell your health care provider: ? About any new moles or changes in moles, especially if there is a change in a mole's shape or color. ? If you have a mole that is larger than the size of a pencil eraser.  If any of your family members has a history of skin cancer, especially at a young age, talk with your health care provider about genetic screening.  Always use sunscreen. Apply sunscreen liberally and repeatedly throughout the day.  Whenever you are outside, protect yourself by wearing long sleeves, pants, a wide-brimmed hat, and sunglasses.  What should I know about osteoporosis? Osteoporosis is a condition in which bone destruction happens more quickly than new bone creation. After menopause, you may be at an increased risk for osteoporosis. To help prevent osteoporosis or the bone fractures that can happen because of osteoporosis, the following is recommended:  If you are 59-91 years old, get at least 1,000 mg of calcium and at least 600 mg of vitamin D per day.  If you are older than age 44 but younger than age 39, get at least 1,200 mg of calcium and at least 600 mg of  vitamin D per day.  If you are older than age 29, get at least 1,200 mg of calcium and at least 800 mg of vitamin D per day.  Smoking and excessive alcohol intake increase the risk of osteoporosis. Eat foods that are rich in calcium and vitamin D, and do weight-bearing exercises several times each week as directed by your health care provider. What should I know about how menopause affects my mental health? Depression may occur at any age, but it is more common as you become older. Common symptoms of depression include:  Low or sad mood.  Changes in sleep patterns.  Changes in appetite or eating patterns.  Feeling an overall lack of motivation or enjoyment of activities that you previously enjoyed.  Frequent crying spells.  Talk with your health care provider if you think  that you are experiencing depression. What should I know about immunizations? It is important that you get and maintain your immunizations. These include:  Tetanus, diphtheria, and pertussis (Tdap) booster vaccine.  Influenza every year before the flu season begins.  Pneumonia vaccine.  Shingles vaccine.  Your health care provider may also recommend other immunizations. This information is not intended to replace advice given to you by your health care provider. Make sure you discuss any questions you have with your health care provider. Document Released: 09/17/2005 Document Revised: 02/13/2016 Document Reviewed: 04/29/2015 Elsevier Interactive Patient Education  2018 [employer].

## 2018-05-22 ENCOUNTER — Ambulatory Visit (INDEPENDENT_AMBULATORY_CARE_PROVIDER_SITE_OTHER): Payer: Medicare Other | Admitting: Sports Medicine

## 2018-05-22 ENCOUNTER — Ambulatory Visit (INDEPENDENT_AMBULATORY_CARE_PROVIDER_SITE_OTHER): Payer: Medicare Other

## 2018-05-22 ENCOUNTER — Encounter: Payer: Self-pay | Admitting: Sports Medicine

## 2018-05-22 DIAGNOSIS — R05 Cough: Secondary | ICD-10-CM | POA: Diagnosis not present

## 2018-05-22 DIAGNOSIS — J208 Acute bronchitis due to other specified organisms: Secondary | ICD-10-CM | POA: Diagnosis not present

## 2018-05-22 DIAGNOSIS — J209 Acute bronchitis, unspecified: Secondary | ICD-10-CM

## 2018-05-22 MED ORDER — PREDNISONE 50 MG PO TABS
50.0000 mg | ORAL_TABLET | Freq: Every day | ORAL | 0 refills | Status: DC
Start: 2018-05-22 — End: 2018-07-13

## 2018-05-22 MED ORDER — AZITHROMYCIN 250 MG PO TABS: ORAL_TABLET | ORAL | 0 refills | Status: DC

## 2018-05-22 MED ORDER — BENZONATATE 200 MG PO CAPS: 200.0000 mg | ORAL_CAPSULE | Freq: Three times a day (TID) | ORAL | 0 refills | Status: DC | PRN

## 2018-05-22 NOTE — Assessment & Plan Note (Signed)
Over a 30-pack-year smoking history, there is likely mild COPD as well. Chest x-ray, prednisone, azithromycin, Tessalon Perles. She is having some right hip pain, we can address this at the follow-up visit if still hurting after a couple of weeks.

## 2018-05-22 NOTE — Progress Notes (Signed)
Subjective:    CC: Coughing  HPI: This is a pleasant 77 year old female, for the past 3 to 4 days she has had a worsening cough productive of yellowish sputum, mild shortness of breath, no wheezing.  She does have a 30-pack-year smoking history, she quit some years back.  Symptoms are moderate, persistent, no chest pain.  No fevers, chills, night sweats, weight loss, no muscle aches or body aches.  No skin rash, no GI symptoms.  In addition she is noted a several week history of pain in her right anterolateral thigh.  Moderate, persistent without radiation.  I reviewed the past medical history, family history, social history, surgical history, and allergies today and no changes were needed.  Please see the problem list section below in epic for further details.  Past Medical History: Past Medical History:  Diagnosis Date  . Thyroid disease    Past Surgical History: No past surgical history on file. Social History: Social History   Socioeconomic History  . Marital status: Married    Spouse name: [name]  . Number of children: 3  . Years of education: high school   . Highest education level: 12th grade  Occupational History  . Occupation: retired    Comment: Tree surgeon  . Financial resource strain: Not hard at all  . Food insecurity:    Worry: Never true    Inability: Never true  . Transportation needs:    Medical: No    Non-medical: No  Tobacco Use  . Smoking status: Former Smoker    Packs/day: 1.00    Years: 15.00    Pack years: 15.00  . Smokeless tobacco: Never Used  Substance and Sexual Activity  . Alcohol use: Yes    Alcohol/week: 1.0 standard drinks    Types: 1 Glasses of wine per week    Comment: socially  . Drug use: Never  . Sexual activity: Yes  Lifestyle  . Physical activity:    Days per week: 7 days    Minutes per session: 20 min  . Stress: Not at all  Relationships  . Social connections:    Talks on phone: Twice a week    Gets  together: Twice a week    Attends religious service: Never    Active member of club or organization: No    Attends meetings of clubs or organizations: Never    Relationship status: Married  Other Topics Concern  . Not on file  Social History Narrative   Lives with husband and dog. Gets out with family a lot.   Family History: Family History  Problem Relation Age of Onset  . Diabetes Maternal Aunt   . Heart attack Maternal Aunt    Allergies: Allergies  Allergen Reactions  . Fesoterodine Other (See Comments)    Abdominal pain 08/30/13 patient stated does not recall allergy   . Gabapentin Other (See Comments)    "felt like she was going to die" 08/30/13 patient stated does not recall allergy    Medications: See med rec.  Review of Systems: No fevers, chills, night sweats, weight loss, chest pain, or shortness of breath.   Objective:    General: Well Developed, well nourished, and in no acute distress.  Neuro: Alert and oriented x3, extra-ocular muscles intact, sensation grossly intact.  HEENT: Normocephalic, atraumatic, pupils equal round reactive to light, neck supple, no masses, no lymphadenopathy, thyroid nonpalpable.  Oropharynx, nasopharynx, ear canals unremarkable. Skin: Warm and dry, no rashes. Cardiac: Regular rate and rhythm,  no murmurs rubs or gallops, no lower extremity edema.  Respiratory: Clear to auscultation bilaterally. Not using accessory muscles, speaking in full sentences.  Impression and Recommendations:    Acute bronchitis due to other specified organisms Over a 30-pack-year smoking history, there is likely mild COPD as well. Chest x-ray, prednisone, azithromycin, Tessalon Perles. She is having some right hip pain, we can address this at the follow-up visit if still hurting after a couple of weeks. ___________________________________________ Gwen Her. Dianah Field, M.D., ABFM., CAQSM. Primary Care and Sports Medicine [facility]  [facility]  Adjunct Professor of [employer]

## 2018-06-12 ENCOUNTER — Ambulatory Visit: Payer: Medicare Other | Admitting: Sports Medicine

## 2018-06-20 ENCOUNTER — Encounter: Payer: Medicare Other | Admitting: Osteopathic Medicine

## 2018-06-27 ENCOUNTER — Ambulatory Visit (INDEPENDENT_AMBULATORY_CARE_PROVIDER_SITE_OTHER): Payer: Medicare Other | Admitting: Sports Medicine

## 2018-06-27 ENCOUNTER — Encounter: Payer: Self-pay | Admitting: Sports Medicine

## 2018-06-27 DIAGNOSIS — I781 Nevus, non-neoplastic: Secondary | ICD-10-CM | POA: Insufficient documentation

## 2018-06-27 DIAGNOSIS — M1611 Unilateral primary osteoarthritis, right hip: Secondary | ICD-10-CM | POA: Diagnosis not present

## 2018-06-27 NOTE — Progress Notes (Signed)
Subjective:    CC: Several issues  HPI: Right hip pain: Present for over a month, localized anteriorly groin, worse with prolonged weightbearing.  Oral NSAIDs, Tylenol has not been effective.  Spider veins: Present for years, worked on her feet and spent lots of time walking.  I reviewed the past medical history, family history, social history, surgical history, and allergies today and no changes were needed.  Please see the problem list section below in epic for further details.  Past Medical History: Past Medical History:  Diagnosis Date  . Thyroid disease    Past Surgical History: No past surgical history on file. Social History: Social History   Socioeconomic History  . Marital status: Married    Spouse name: [name]  . Number of children: 3  . Years of education: high school   . Highest education level: 12th grade  Occupational History  . Occupation: retired    Comment: Tree surgeon  . Financial resource strain: Not hard at all  . Food insecurity:    Worry: Never true    Inability: Never true  . Transportation needs:    Medical: No    Non-medical: No  Tobacco Use  . Smoking status: Former Smoker    Packs/day: 1.00    Years: 15.00    Pack years: 15.00  . Smokeless tobacco: Never Used  Substance and Sexual Activity  . Alcohol use: Yes    Alcohol/week: 1.0 standard drinks    Types: 1 Glasses of wine per week    Comment: socially  . Drug use: Never  . Sexual activity: Yes  Lifestyle  . Physical activity:    Days per week: 7 days    Minutes per session: 20 min  . Stress: Not at all  Relationships  . Social connections:    Talks on phone: Twice a week    Gets together: Twice a week    Attends religious service: Never    Active member of club or organization: No    Attends meetings of clubs or organizations: Never    Relationship status: Married  Other Topics Concern  . Not on file  Social History Narrative   Lives with husband and dog. Gets  out with family a lot.   Family History: Family History  Problem Relation Age of Onset  . Diabetes Maternal Aunt   . Heart attack Maternal Aunt    Allergies: Allergies  Allergen Reactions  . Fesoterodine Other (See Comments)    Abdominal pain 08/30/13 patient stated does not recall allergy   . Gabapentin Other (See Comments)    "felt like she was going to die" 08/30/13 patient stated does not recall allergy    Medications: See med rec.  Review of Systems: No fevers, chills, night sweats, weight loss, chest pain, or shortness of breath.   Objective:    General: Well Developed, well nourished, and in no acute distress.  Neuro: Alert and oriented x3, extra-ocular muscles intact, sensation grossly intact.  HEENT: Normocephalic, atraumatic, pupils equal round reactive to light, neck supple, no masses, no lymphadenopathy, thyroid nonpalpable.  Skin: Warm and dry, no rashes.  Multiple bilateral lower extremity spider veins and reticular veins. Cardiac: Regular rate and rhythm, no murmurs rubs or gallops, no lower extremity edema.  Respiratory: Clear to auscultation bilaterally. Not using accessory muscles, speaking in full sentences. Right hip: ROM IR: 60 Deg, reproduction of pain with internal rotation, ER: 60 Deg, Flexion: 120 Deg, Extension: 100 Deg, Abduction: 45 Deg, Adduction:  48 Deg Strength IR: 5/5, ER: 5/5, Flexion: 5/5, Extension: 5/5, Abduction: 5/5, Adduction: 5/5 Pelvic alignment unremarkable to inspection and palpation. Standing hip rotation and gait without trendelenburg / unsteadiness. Greater trochanter without tenderness to palpation. No tenderness over piriformis. No SI joint tenderness and normal minimal SI movement.  Procedure: Real-time Ultrasound Guided Injection of right hip joint Device: GE Logiq E  Verbal informed consent obtained.  Time-out conducted.  Noted no overlying erythema, induration, or other signs of local infection.  Skin prepped in a  sterile fashion.  Local anesthesia: Topical Ethyl chloride.  With sterile technique and under real time ultrasound guidance: 22-gauge spinal needle advanced to the femoral head/neck junction, contacted bone and injected 1 cc kenalog 40, 2 cc lidocaine, 2 cc bupivacaine. Completed without difficulty  Pain immediately resolved suggesting accurate placement of the medication.  Advised to call if fevers/chills, erythema, induration, drainage, or persistent bleeding.  Images permanently stored and available for review in the ultrasound unit.  Impression: Technically successful ultrasound guided injection.  Procedure: Sclerotherapy of multiple right lower extremity spider veins Consent obtained and verified. Time-out conducted. Noted no overlying erythema, induration, or other signs of local infection. Skin prepped in a sterile fashion. Completed without difficulty. Meds: Using a 30-gauge needle I injected a total of approximately 0.3 cc hypertonic saline and 0.3 cc lidocaine into multiple right lower extremity spider veins from the ankle up to just distal to the knee, which were then compressed with cotton balls and tape.  I took care to avoid extravasation of the sclerosant in the subcutaneous tissues. Advised to call if fevers/chills, erythema, induration, drainage, or persistent bleeding.  Impression and Recommendations:    Primary osteoarthritis of right hip Did not respond to analgesics and Tylenol. Hip joint injection, x-rays.   Rehab exercises given. Return in 1 month.  Spider veins Patient may return for sclerotherapy if she desires. ___________________________________________ Gwen Her. Dianah Field, M.D., ABFM., CAQSM. Primary Care and Sports Medicine [facility]  Adjunct Professor of [place] [employer] [facility] [employer]

## 2018-06-27 NOTE — Assessment & Plan Note (Signed)
Did not respond to analgesics and Tylenol. Hip joint injection, x-rays.   Rehab exercises given. Return in 1 month.

## 2018-06-27 NOTE — Assessment & Plan Note (Signed)
Patient may return for sclerotherapy if she desires.

## 2018-07-13 ENCOUNTER — Ambulatory Visit (INDEPENDENT_AMBULATORY_CARE_PROVIDER_SITE_OTHER): Payer: Medicare Other | Admitting: Osteopathic Medicine

## 2018-07-13 ENCOUNTER — Encounter: Payer: Self-pay | Admitting: Osteopathic Medicine

## 2018-07-13 VITALS — BP 121/99 | HR 83 | Temp 98.0°F | Wt 119.0 lb

## 2018-07-13 DIAGNOSIS — D649 Anemia, unspecified: Secondary | ICD-10-CM

## 2018-07-13 DIAGNOSIS — K219 Gastro-esophageal reflux disease without esophagitis: Secondary | ICD-10-CM

## 2018-07-13 DIAGNOSIS — E785 Hyperlipidemia, unspecified: Secondary | ICD-10-CM | POA: Diagnosis not present

## 2018-07-13 DIAGNOSIS — Z23 Encounter for immunization: Secondary | ICD-10-CM

## 2018-07-13 DIAGNOSIS — M8580 Other specified disorders of bone density and structure, unspecified site: Secondary | ICD-10-CM

## 2018-07-13 DIAGNOSIS — E039 Hypothyroidism, unspecified: Secondary | ICD-10-CM | POA: Diagnosis not present

## 2018-07-13 DIAGNOSIS — Z0000 Encounter for general adult medical examination without abnormal findings: Secondary | ICD-10-CM

## 2018-07-13 MED ORDER — ZOSTER VAC RECOMB ADJUVANTED 50 MCG/0.5ML IM SUSR: 0.5000 mL | Freq: Once | INTRAMUSCULAR | 1 refills | Status: AC

## 2018-07-13 NOTE — Progress Notes (Signed)
HPI: Angela Myers is a 77 y.o. female who  has a past medical history of Thyroid disease.  she presents to [facility] today, 07/13/18,  for chief complaint of: Annual physical    Patient here for annual physical / wellness exam.  See preventive care reviewed as below.    Additional concerns today include:  None    Past medical, surgical, social and family history reviewed:  Patient Active Problem List   Diagnosis Date Noted  . Primary osteoarthritis of right hip 06/27/2018  . Spider veins 06/27/2018  . Acute bronchitis due to other specified organisms 05/22/2018  . Hypothyroidism 11/24/2016  . Carotid artery stenosis 11/24/2016  . HLD (hyperlipidemia) 11/24/2016  . Psoriasis 11/24/2016  . Anxiety disorder 09/17/2013  . Anemia 06/29/2013  . GERD (gastroesophageal reflux disease) 06/29/2013  . History of breast cancer 06/29/2013    No past surgical history on file.  Social History   Tobacco Use  . Smoking status: Former Smoker    Packs/day: 1.00    Years: 15.00    Pack years: 15.00  . Smokeless tobacco: Never Used  Substance Use Topics  . Alcohol use: Yes    Alcohol/week: 1.0 standard drinks    Types: 1 Glasses of wine per week    Comment: socially    Family History  Problem Relation Age of Onset  . Diabetes Maternal Aunt   . Heart attack Maternal Aunt      Current medication list and allergy/intolerance information reviewed:    Current Outpatient Medications  Medication Sig Dispense Refill  . aspirin EC 81 MG tablet Take by mouth.    Marland Kitchen azithromycin (ZITHROMAX Z-PAK) 250 MG tablet Take 2 tablets (500 mg) on  Day 1,  followed by 1 tablet (250 mg) once daily on Days 2 through 5. 6 tablet 0  . benzonatate (TESSALON) 200 MG capsule Take 1 capsule (200 mg total) by mouth 3 (three) times daily as needed for cough. 45 capsule 0  . cyanocobalamin (V-R VITAMIN B-12) 500 MCG tablet Take by mouth.    . Ipratropium-Albuterol  (COMBIVENT) 20-100 MCG/ACT AERS respimat Inhale 1 puff into the lungs every 6 (six) hours as needed for wheezing or shortness of breath. 1 Inhaler 1  . levothyroxine (SYNTHROID, LEVOTHROID) 100 MCG tablet TAKE 1 TABLET BY MOUTH  DAILY BEFORE BREAKFAST 90 tablet 3  . Nebulizer MISC Nebulizer machine and necessary tubing and other supplies per patient preference/insurance coverage. Dx: J18.1, R05 1 each prn  . omeprazole (PRILOSEC) 20 MG capsule TAKE 1 CAPSULE BY MOUTH  DAILY 90 capsule 1  . PARoxetine (PAXIL) 40 MG tablet TAKE 1 TABLET BY MOUTH  DAILY 90 tablet 3  . predniSONE (DELTASONE) 50 MG tablet Take 1 tablet (50 mg total) by mouth daily. 5 tablet 0  . Pumpkin Seed-Soy Germ (AZO BLADDER CONTROL/GO-LESS) CAPS Take by mouth.    . simvastatin (ZOCOR) 40 MG tablet Take 1 tablet (40 mg total) by mouth at bedtime. 90 tablet 1   No current facility-administered medications for this visit.     Allergies  Allergen Reactions  . Fesoterodine Other (See Comments)    Abdominal pain 08/30/13 patient stated does not recall allergy   . Gabapentin Other (See Comments)    "felt like she was going to die" 08/30/13 patient stated does not recall allergy       Review of Systems:  Constitutional:  No  fever, no chills, No recent illness, No unintentional weight changes.  No significant fatigue.   HEENT: No  headache, no vision change, no hearing change, No sore throat, No  sinus pressure  Cardiac: No  chest pain, No  pressure, No palpitations  Respiratory:  No  shortness of breath. No  Cough  Gastrointestinal: No  abdominal pain, No  nausea, No  vomiting,  No  blood in stool, No  diarrhea, No  constipation   Musculoskeletal: No new myalgia/arthralgia  Skin: No  Rash, No other wounds/concerning lesions  Genitourinary: No  incontinence, No  abnormal genital bleeding, No abnormal genital discharge  Hem/Onc: No  easy bruising/bleeding  Endocrine: No cold intolerance,  No heat intolerance. No  polyuria/polydipsia/polyphagia   Neurologic: No  weakness, No  dizziness  Psychiatric: No  concerns with depression, No  concerns with anxiety  Exam:  BP (!) 121/99   Pulse 83   Temp 98 F (36.7 C) (Oral)   Wt 119 lb (54 kg)   BMI 20.43 kg/m   Constitutional: VS see above. General Appearance: alert, well-developed, well-nourished, NAD  Eyes: Normal lids and conjunctive, non-icteric sclera  Ears, Nose, Mouth, Throat: MMM, Normal external inspection ears/nares/mouth/lips/gums. TM normal bilaterally. Pharynx/tonsils no erythema, no exudate. Nasal mucosa normal.   Neck: No masses, trachea midline. No thyroid enlargement. No tenderness/mass appreciated. No lymphadenopathy  Respiratory: Normal respiratory effort. no wheeze, no rhonchi, no rales  Cardiovascular: S1/S2 normal, no murmur, no rub/gallop auscultated. RRR. No lower extremity edema.   Gastrointestinal: Nontender, no masses. No hepatomegaly, no splenomegaly. No hernia appreciated. Bowel sounds normal. Rectal exam deferred.   Musculoskeletal: Gait normal. No clubbing/cyanosis of digits.   Neurological: Normal balance/coordination. No tremor. No cranial nerve deficit on limited exam. Motor and sensation intact and symmetric. Cerebellar reflexes intact.   Skin: warm, dry, intact. No rash/ulcer. No concerning nevi or subq nodules on limited exam.    Psychiatric: Normal judgment/insight. Normal mood and affect. Oriented x3.    No results found for this or any previous visit (from the past 72 hour(s)).  No results found.   ASSESSMENT/PLAN: The primary encounter diagnosis was Annual physical exam. Diagnoses of Hypothyroidism, unspecified type, Anemia, unspecified type, Gastroesophageal reflux disease, esophagitis presence not specified, Hyperlipidemia, unspecified hyperlipidemia type, Osteopenia, unspecified location, Need for shingles vaccine, and Need for tetanus booster were also pertinent to this visit.   Orders Placed  This Encounter  Procedures  . DG Bone Density  . CBC  . COMPLETE METABOLIC PANEL WITH GFR  . Lipid panel  . TSH  . VITAMIN D 25 Hydroxy (Vit-D Deficiency, Fractures)    Meds ordered this encounter  Medications  . Zoster Vaccine Adjuvanted [facility]) injection    Sig: Inject 0.5 mLs into the muscle once for 1 dose. Repeat dose 2-6 months    Dispense:  0.5 mL    Refill:  1   Immunization History  Administered Date(s) Administered  . H1N1 07/18/2008  . Influenza, High Dose Seasonal PF 05/30/2017, 04/26/2018  . Influenza-Unspecified 04/16/2010, 05/16/2013, 06/03/2014, 10/13/2015, 05/06/2016  . Pneumococcal Conjugate-13 11/13/2015  . Pneumococcal Polysaccharide-23 02/16/2007     Patient Instructions  General Preventive Care  Most recent routine screening lipids/other labs: ordered today.  Tobacco: don't start again! (Hopefully this is easy!)  Alcohol: responsible moderation is ok for most adults.  Exercise: as tolerated to reduce risk of cardiovascular disease and diabetes. Strength training will also prevent osteoporosis.   Mental health: if need for mental health care (medicines, counseling, other), or concerns about moods, please let me know!  Sexual health: if ever need for STD testing, or if concerns with libido/pain problems, please let me know!   Advanced Directive: Living Will and/or Healthcare Power of Attorney recommended for all adults, regardless of age or health. Vaccines  Flu vaccine: recommended for everyone, every fall.   Shingles vaccine: Shingrix recommended after age 8. See printed prescription - take this to pharmacy if you'd like to get this vaccine!   Pneumonia vaccines: all done!   Tetanus booster: Tdap recommended every 10 years. None on file for you - will see if we can track down some records.  Cancer screenings   Colon cancer screening: typically stopped after afe 75 if previous testing was normal.   Breast cancer screening: mammogram  recommended annually after age 87, optional to stop at age 78. If you'd like to continue mammograms, please let me know!   Cervical cancer screening: Can stop Pap at age 54.   Lung cancer screening: not needed since you quit smoking.  Infection screenings . HIV & Gonorrhea/Chlamydia: screening as needed . Hepatitis C: recommended for anyone born 61-1964 - not needed! . TB: certain at-risk populations.  Other . Bone Density Test: recommended for women at age 52, every other year - you are due!        Visit summary with medication list and pertinent instructions was printed for patient to review. All questions at time of visit were answered - patient instructed to contact office with any additional concerns or updates. ER/RTC precautions were reviewed with the patient.    Please note: voice recognition software was used to produce this document, and typos may escape review. Please contact Dr. Sheppard Coil for any needed clarifications.     Follow-up plan: Return in about 1 year (around 07/14/2019) for annual physical exam & medicare wellness visit .

## 2018-07-13 NOTE — Patient Instructions (Addendum)
General Preventive Care  Most recent routine screening lipids/other labs: ordered today.  Tobacco: don't start again! (Hopefully this is easy!)  Alcohol: responsible moderation is ok for most adults.  Exercise: as tolerated to reduce risk of cardiovascular disease and diabetes. Strength training will also prevent osteoporosis.   Mental health: if need for mental health care (medicines, counseling, other), or concerns about moods, please let me know!   Sexual health: if ever need for STD testing, or if concerns with libido/pain problems, please let me know!   Advanced Directive: Living Will and/or Healthcare Power of Attorney recommended for all adults, regardless of age or health. Vaccines  Flu vaccine: recommended for everyone, every fall.   Shingles vaccine: Shingrix recommended after age 22. See printed prescription - take this to pharmacy if you'd like to get this vaccine!   Pneumonia vaccines: all done!   Tetanus booster: Tdap recommended every 10 years. None on file for you - will see if we can track down some records.  Cancer screenings   Colon cancer screening: typically stopped after afe 75 if previous testing was normal.   Breast cancer screening: mammogram recommended annually after age 53, optional to stop at age 81. If you'd like to continue mammograms, please let me know!   Cervical cancer screening: Can stop Pap at age 81.   Lung cancer screening: not needed since you quit smoking.  Infection screenings . HIV & Gonorrhea/Chlamydia: screening as needed . Hepatitis C: recommended for anyone born 64-1964 - not needed! . TB: certain at-risk populations.  Other . Bone Density Test: recommended for women at age 95, every other year - you are due!

## 2018-07-24 DIAGNOSIS — D649 Anemia, unspecified: Secondary | ICD-10-CM | POA: Diagnosis not present

## 2018-07-24 DIAGNOSIS — R7989 Other specified abnormal findings of blood chemistry: Secondary | ICD-10-CM | POA: Diagnosis not present

## 2018-07-24 DIAGNOSIS — E785 Hyperlipidemia, unspecified: Secondary | ICD-10-CM | POA: Diagnosis not present

## 2018-07-24 DIAGNOSIS — K219 Gastro-esophageal reflux disease without esophagitis: Secondary | ICD-10-CM | POA: Diagnosis not present

## 2018-07-24 DIAGNOSIS — E039 Hypothyroidism, unspecified: Secondary | ICD-10-CM | POA: Diagnosis not present

## 2018-07-24 DIAGNOSIS — M8580 Other specified disorders of bone density and structure, unspecified site: Secondary | ICD-10-CM | POA: Diagnosis not present

## 2018-07-25 ENCOUNTER — Ambulatory Visit: Payer: Medicare Other | Admitting: Sports Medicine

## 2018-07-25 LAB — COMPLETE METABOLIC PANEL WITH GFR
AG Ratio: 1.9 (calc) (ref 1.0–2.5)
ALT: 9 U/L (ref 6–29)
AST: 16 U/L (ref 10–35)
Albumin: 4.1 g/dL (ref 3.6–5.1)
Alkaline phosphatase (APISO): 63 U/L (ref 33–130)
BUN/Creatinine Ratio: 17 (calc) (ref 6–22)
BUN: 16 mg/dL (ref 7–25)
CALCIUM: 9.7 mg/dL (ref 8.6–10.4)
CO2: 29 mmol/L (ref 20–32)
CREATININE: 0.95 mg/dL — AB (ref 0.60–0.93)
Chloride: 109 mmol/L (ref 98–110)
GFR, EST NON AFRICAN AMERICAN: 58 mL/min/{1.73_m2} — AB (ref >=60)
GFR, Est African American: 67 mL/min/{1.73_m2} (ref >=60)
GLOBULIN: 2.2 g/dL (ref 1.9–3.7)
Glucose, Bld: 92 mg/dL (ref 65–99)
Potassium: 5.2 mmol/L (ref 3.5–5.3)
SODIUM: 144 mmol/L (ref 135–146)
Total Bilirubin: 0.5 mg/dL (ref 0.2–1.2)
Total Protein: 6.3 g/dL (ref 6.1–8.1)

## 2018-07-25 LAB — LIPID PANEL
Cholesterol: 162 mg/dL (ref <200)
HDL: 63 mg/dL (ref >50)
LDL Cholesterol (Calc): 85 mg/dL (calc)
NON-HDL CHOLESTEROL (CALC): 99 mg/dL (ref <130)
Total CHOL/HDL Ratio: 2.6 (calc) (ref <5.0)
Triglycerides: 68 mg/dL (ref <150)

## 2018-07-25 LAB — CBC
HEMATOCRIT: 36.5 % (ref 35.0–45.0)
HEMOGLOBIN: 12.4 g/dL (ref 11.7–15.5)
MCH: 30.6 pg (ref 27.0–33.0)
MCHC: 34 g/dL (ref 32.0–36.0)
MCV: 90.1 fL (ref 80.0–100.0)
MPV: 10.1 fL (ref 7.5–12.5)
Platelets: 242 10*3/uL (ref 140–400)
RBC: 4.05 10*6/uL (ref 3.80–5.10)
RDW: 12.3 % (ref 11.0–15.0)
WBC: 5.7 10*3/uL (ref 3.8–10.8)

## 2018-07-25 LAB — VITAMIN D 25 HYDROXY (VIT D DEFICIENCY, FRACTURES): Vit D, 25-Hydroxy: 31 ng/mL (ref 30–100)

## 2018-07-25 LAB — TSH: TSH: 0.13 mIU/L — ABNORMAL LOW (ref 0.40–4.50)

## 2018-07-26 MED ORDER — LEVOTHYROXINE SODIUM 88 MCG PO TABS: 88.0000 ug | ORAL_TABLET | Freq: Every day | ORAL | 0 refills | Status: DC

## 2018-07-26 NOTE — Addendum Note (Signed)
Addended by: Maryla Morrow on: 07/26/2018 09:05 AM   Modules accepted: Orders

## 2018-08-22 ENCOUNTER — Other Ambulatory Visit: Payer: Self-pay | Admitting: Osteopathic Medicine

## 2018-08-22 ENCOUNTER — Other Ambulatory Visit: Payer: Self-pay

## 2018-08-22 DIAGNOSIS — K219 Gastro-esophageal reflux disease without esophagitis: Secondary | ICD-10-CM

## 2018-08-22 DIAGNOSIS — E039 Hypothyroidism, unspecified: Secondary | ICD-10-CM

## 2018-08-22 MED ORDER — OMEPRAZOLE 20 MG PO CPDR: 20.0000 mg | DELAYED_RELEASE_CAPSULE | Freq: Every day | ORAL | 1 refills | Status: DC

## 2018-08-22 MED ORDER — SIMVASTATIN 40 MG PO TABS: 40.0000 mg | ORAL_TABLET | Freq: Every day | ORAL | 1 refills | Status: DC

## 2018-08-22 NOTE — Telephone Encounter (Signed)
Requested medication (s) are due for refill today:  yes  Requested medication (s) are on the active medication list:  yes  Future visit scheduled:  yes  Last Refill: Levothyroxine; 07/26/18; #90; no refills Simvastatin; 09/23/17; #90; refill x 1 Omeprazole; 01/17/18; #90; refill x 1   Requested Prescriptions  Pending Prescriptions Disp Refills   levothyroxine (SYNTHROID, LEVOTHROID) 88 MCG tablet [Pharmacy Med Name: LEVOTHYROXINE  88MCG  TAB] 90 tablet 0    Sig: TAKE 1 TABLET BY MOUTH  DAILY BEFORE BREAKFAST     Endocrinology:  Hypothyroid Agents Failed - 08/22/2018  1:00 PM      Failed - TSH needs to be rechecked within 3 months after an abnormal result. Refill until TSH is due.      Failed - TSH in normal range and within 360 days    TSH  Date Value Ref Range Status  07/24/2018 0.13 (L) 0.40 - 4.50 mIU/L Final         Passed - Valid encounter within last 12 months    Recent Outpatient Visits          1 month ago Annual physical exam   [place] [facility], Lanelle Bal, DO   1 month ago Primary osteoarthritis of right hip   [place], Gwen Her, MD   3 months ago Acute bronchitis due to other specified organisms   [place] [facility], Gwen Her, MD   4 months ago Medicare annual wellness visit, subsequent   [facility] [place], DO   7 months ago Elevated blood pressure reading   [place], Lanelle Bal, DO      Future Appointments            In 8 months [place] [facility]           simvastatin (ZOCOR) 40 MG tablet [Pharmacy Med Name: SIMVASTATIN  40MG   TAB] 90 tablet 1    Sig: TAKE 1 TABLET BY MOUTH AT  BEDTIME     Cardiovascular:  Antilipid - Statins Passed - 08/22/2018  1:00 PM      Passed - Total Cholesterol in normal range and  within 360 days    Cholesterol  Date Value Ref Range Status  07/24/2018 162 <200 mg/dL Final         Passed - LDL in normal range and within 360 days    LDL Cholesterol (Calc)  Date Value Ref Range Status  07/24/2018 85 mg/dL (calc) Final    Comment:    Reference range: <100 . Desirable range <100 mg/dL for primary prevention;   <70 mg/dL for patients with CHD or diabetic patients  with > or = 2 CHD risk factors. Marland Kitchen LDL-C is now calculated using the Martin-Hopkins  calculation, which is a validated novel method providing  better accuracy than the Friedewald equation in the  estimation of LDL-C.  Cresenciano Genre et al. Annamaria Helling. 6389;373(42): 2061-2068  (http://education.[facility].com/faq/FAQ164)          Passed - HDL in normal range and within 360 days    HDL  Date Value Ref Range Status  07/24/2018 63 >50 mg/dL Final         Passed - Triglycerides in normal range and within 360 days    Triglycerides  Date Value Ref Range Status  07/24/2018 68 <150 mg/dL Final  Passed - Patient is not pregnant      Passed - Valid encounter within last 12 months    Recent Outpatient Visits          1 month ago Annual physical exam   [place] [facility], Lanelle Bal, DO   1 month ago Primary osteoarthritis of right hip   [place], Gwen Her, MD   3 months ago Acute bronchitis due to other specified organisms   [place] [facility], Gwen Her, MD   4 months ago Medicare annual wellness visit, subsequent   [facility] [place], DO   7 months ago Elevated blood pressure reading   [place], Lanelle Bal, DO      Future Appointments            In 8 months [place] [facility]           omeprazole (PRILOSEC) 20 MG capsule [Pharmacy Med  Name: OMEPRAZOLE  20MG   CAP] 90 capsule 1    Sig: TAKE 1 CAPSULE BY MOUTH  DAILY     Gastroenterology: Proton Pump Inhibitors Passed - 08/22/2018  1:00 PM      Passed - Valid encounter within last 12 months    Recent Outpatient Visits          1 month ago Annual physical exam   [place] [facility], Lanelle Bal, DO   1 month ago Primary osteoarthritis of right hip   [place] [facility], Gwen Her, MD   3 months ago Acute bronchitis due to other specified organisms   [place], Gwen Her, MD   4 months ago Medicare annual wellness visit, subsequent   [facility] [place], DO   7 months ago Elevated blood pressure reading   [place], Lanelle Bal, DO      Future Appointments            In 8 months [place] [facility]

## 2018-09-13 DIAGNOSIS — E039 Hypothyroidism, unspecified: Secondary | ICD-10-CM | POA: Diagnosis not present

## 2018-09-14 ENCOUNTER — Other Ambulatory Visit: Payer: Self-pay | Admitting: Osteopathic Medicine

## 2018-09-14 DIAGNOSIS — E039 Hypothyroidism, unspecified: Secondary | ICD-10-CM

## 2018-09-14 LAB — TSH: TSH: 0.08 mIU/L — ABNORMAL LOW (ref 0.40–4.50)

## 2018-09-14 MED ORDER — LEVOTHYROXINE SODIUM 75 MCG PO TABS: 75.0000 ug | ORAL_TABLET | Freq: Every day | ORAL | 0 refills | Status: DC

## 2018-09-18 ENCOUNTER — Ambulatory Visit: Payer: Medicare Other | Admitting: Osteopathic Medicine

## 2018-09-18 ENCOUNTER — Encounter: Payer: Self-pay | Admitting: Osteopathic Medicine

## 2018-09-18 VITALS — BP 158/59 | HR 107 | Temp 98.7°F | Wt 126.8 lb

## 2018-09-18 DIAGNOSIS — I499 Cardiac arrhythmia, unspecified: Secondary | ICD-10-CM

## 2018-09-18 DIAGNOSIS — R05 Cough: Secondary | ICD-10-CM

## 2018-09-18 DIAGNOSIS — E039 Hypothyroidism, unspecified: Secondary | ICD-10-CM | POA: Diagnosis not present

## 2018-09-18 DIAGNOSIS — R059 Cough, unspecified: Secondary | ICD-10-CM

## 2018-09-18 MED ORDER — IPRATROPIUM-ALBUTEROL 20-100 MCG/ACT IN AERS: 1.0000 | INHALATION_SPRAY | Freq: Four times a day (QID) | RESPIRATORY_TRACT | 1 refills | Status: DC | PRN

## 2018-09-18 MED ORDER — AZITHROMYCIN 250 MG PO TABS: ORAL_TABLET | ORAL | 0 refills | Status: DC

## 2018-09-18 MED ORDER — PREDNISONE 20 MG PO TABS: 20.0000 mg | ORAL_TABLET | Freq: Two times a day (BID) | ORAL | 0 refills | Status: DC

## 2018-09-18 NOTE — Patient Instructions (Signed)

## 2018-09-18 NOTE — Progress Notes (Signed)
HPI: Angela Myers is a 78 y.o. female who  has a past medical history of Thyroid disease.  she presents to [facility] today, 09/18/18,  for chief complaint of: Concern for bronchitis  . Location: chest  . Quality: short of breath, coughing (nonproductive)  . Severity: gradually cough seems getting worse . Duration: 1 week  . Modifying factors: no OTC meds or Rx tried . Assoc signs/symptoms: reports no fever though felt hot last week, hasn't checked temp this week . Tachycardic, reports no CP/palpitations . Recently we reduced thyroid Rx dose after labs 09/13/2018 showed low TSH at 0.08 . Hx COPD. Hospitalization last spring with multifocal PNA seen on CT (Xray looked ok but pt was hypoxic on RA down to 93%)        Past medical, surgical, social and family history reviewed and updated as necessary.   Current medication list and allergy/intolerance information reviewed:    Current Outpatient Medications  Medication Sig Dispense Refill  . aspirin EC 81 MG tablet Take by mouth.    . cyanocobalamin (V-R VITAMIN B-12) 500 MCG tablet Take by mouth.    . Ipratropium-Albuterol (COMBIVENT) 20-100 MCG/ACT AERS respimat Inhale 1 puff into the lungs every 6 (six) hours as needed for wheezing or shortness of breath. 1 Inhaler 1  . levothyroxine (SYNTHROID, LEVOTHROID) 75 MCG tablet TAKE 1 TABLET(75 MCG) BY MOUTH DAILY BEFORE BREAKFAST 90 tablet 0  . Nebulizer MISC Nebulizer machine and necessary tubing and other supplies per patient preference/insurance coverage. Dx: J18.1, R05 1 each prn  . omeprazole (PRILOSEC) 20 MG capsule Take 1 capsule (20 mg total) by mouth daily. 90 capsule 1  . PARoxetine (PAXIL) 40 MG tablet TAKE 1 TABLET BY MOUTH  DAILY 90 tablet 3  . Pumpkin Seed-Soy Germ (AZO BLADDER CONTROL/GO-LESS) CAPS Take by mouth.    . simvastatin (ZOCOR) 40 MG tablet Take 1 tablet (40 mg total) by mouth at bedtime. 90 tablet 1  . azithromycin  (ZITHROMAX) 250 MG tablet 2 tabs po on Day 1, then 1 tab daily Days 2 - 5 6 tablet 0  . predniSONE (DELTASONE) 20 MG tablet Take 1 tablet (20 mg total) by mouth 2 (two) times daily with a meal. 10 tablet 0   No current facility-administered medications for this visit.     Allergies  Allergen Reactions  . Fesoterodine Other (See Comments)    Abdominal pain 08/30/13 patient stated does not recall allergy    . Gabapentin Other (See Comments)    "felt like she was going to die" 08/30/13 patient stated does not recall allergy       Review of Systems:  Constitutional:  +subjective fever last week, no chills, +recent illness, No unintentional weight changes. +significant fatigue.   HEENT: No  headache, no vision change, no hearing change, No sore throat, No  sinus pressure  Cardiac: No  chest pain, No  pressure, No palpitations  Respiratory:  +shortness of breath. +Cough  Gastrointestinal: No  abdominal pain, No  nausea, No  vomiting,  No  blood in stool, No  diarrhea  Musculoskeletal: No new myalgia/arthralgia  Skin: No  Rash  Neurologic: No  weakness, No  dizziness  Exam:  BP (!) 158/59 (BP Location: Left Arm, Patient Position: Sitting, Cuff Size: Normal)   Pulse (!) 107   Temp 98.7 F (37.1 C) (Oral)   Wt 126 lb 12.8 oz (57.5 kg)   SpO2 97%   BMI 21.77 kg/m  Constitutional: VS see above. General Appearance: alert, well-developed, well-nourished, NAD  Eyes: Normal lids and conjunctive, non-icteric sclera  Ears, Nose, Mouth, Throat: MMM, Normal external inspection ears/nares/mouth/lips/gums. TM normal bilaterally. Pharynx/tonsils no erythema, no exudate. Nasal mucosa normal.   Neck: No masses, trachea midline. No tenderness/mass appreciated. No lymphadenopathy  Respiratory: Increased respiratory effort. no wheeze, no rhonchi, no rales  Cardiovascular: S1/S2 normal, no murmur, no rub/gallop auscultated. Irreg/irreg ?fibrillation versus respiratory? No lower extremity  edema.   Gastrointestinal: Nontender, no masses. Bowel sounds normal.  Musculoskeletal: Gait normal.   Neurological: Normal balance/coordination. No tremor.   Skin: warm, dry, intact.   Psychiatric: Normal judgment/insight. Normal mood and affect.      EKG interpretation: Rate: 106 Rhythm: sinus but some irregular/sinus arhythmia No ST/T changes concerning for acute ischemia/infarct  Previous EKG 11/2016 sinus rhythm HR 68, also done for irregular beat on auscultation      CXR on personal review:          ASSESSMENT/PLAN: The primary encounter diagnosis was Cough. Diagnoses of Irregular heart beat and Hypothyroidism, unspecified type were also pertinent to this visit.   Low risk for PE by Well's, I think more likely COPD exacerbation. SaO2 is ok, will get CXR and treat as below.   Meds ordered this encounter  Medications  . azithromycin (ZITHROMAX) 250 MG tablet    Sig: 2 tabs po on Day 1, then 1 tab daily Days 2 - 5    Dispense:  6 tablet    Refill:  0  . predniSONE (DELTASONE) 20 MG tablet    Sig: Take 1 tablet (20 mg total) by mouth 2 (two) times daily with a meal.    Dispense:  10 tablet    Refill:  0  . Ipratropium-Albuterol (COMBIVENT) 20-100 MCG/ACT AERS respimat    Sig: Inhale 1 puff into the lungs every 6 (six) hours as needed for wheezing or shortness of breath.    Dispense:  1 Inhaler    Refill:  1    Orders Placed This Encounter  Procedures  . DG Chest 2 View    Patient Instructions  Acute Bronchitis, Adult  Acute bronchitis is sudden (acute) swelling of the air tubes (bronchi) in the lungs. Acute bronchitis causes these tubes to fill with mucus, which can make it hard to breathe. It can also cause coughing or wheezing. In adults, acute bronchitis usually goes away within 2 weeks. A cough caused by bronchitis may last up to 3 or more weeks. Smoking, allergies, and asthma can make the condition worse. Repeated episodes of bronchitis may cause  further lung problems, such as chronic obstructive pulmonary disease (COPD). What are the causes? This condition can be caused by germs and by substances that irritate the lungs, including:  Cold and flu viruses. This condition is most often caused by the same virus that causes a cold.  Bacteria.  Exposure to tobacco smoke, dust, fumes, and air pollution. What increases the risk? This condition is more likely to develop in people who:  Have close contact with someone with acute bronchitis.  Are exposed to lung irritants, such as tobacco smoke, dust, fumes, and vapors.  Have a weak immune system.  Have a respiratory condition such as asthma. What are the signs or symptoms? Symptoms of this condition include:  A cough.  Coughing up clear, yellow, or green mucus.  Wheezing.  Chest congestion.  Shortness of breath.  A fever.  Body aches.  Chills.  A sore throat. How  is this diagnosed? This condition is usually diagnosed with a physical exam. During the exam, your health care provider may order tests, such as chest X-rays, to rule out other conditions. He or she may also:  Test a sample of your mucus for bacterial infection.  Check the level of oxygen in your blood. This is done to check for pneumonia.  Do a chest X-ray or lung function testing to rule out pneumonia and other conditions.  Perform blood tests. Your health care provider will also ask about your symptoms and medical history. How is this treated? Most cases of acute bronchitis clear up over time without treatment. Your health care provider may recommend:  Drinking more fluids. Drinking more makes your mucus thinner, which may make it easier to breathe.  Taking a medicine for a fever or cough.  Taking an antibiotic medicine.  Using an inhaler to help improve shortness of breath and to control a cough.  Using a cool mist vaporizer or humidifier to make it easier to breathe. Follow these instructions  at home: Medicines  Take over-the-counter and prescription medicines only as told by your health care provider.  If you were prescribed an antibiotic, take it as told by your health care provider. Do not stop taking the antibiotic even if you start to feel better. General instructions   Get plenty of rest.  Drink enough fluids to keep your urine pale yellow.  Avoid smoking and secondhand smoke. Exposure to cigarette smoke or irritating chemicals will make bronchitis worse. If you smoke and you need help quitting, ask your health care provider. Quitting smoking will help your lungs heal faster.  Use an inhaler, cool mist vaporizer, or humidifier as told by your health care provider.  Keep all follow-up visits as told by your health care provider. This is important. How is this prevented? To lower your risk of getting this condition again:  Wash your hands often with soap and water. If soap and water are not available, use hand sanitizer.  Avoid contact with people who have cold symptoms.  Try not to touch your hands to your mouth, nose, or eyes.  Make sure to get the flu shot every year. Contact a health care provider if:  Your symptoms do not improve in 2 weeks of treatment. Get help right away if:  You cough up blood.  You have chest pain.  You have severe shortness of breath.  You become dehydrated.  You faint or keep feeling like you are going to faint.  You keep vomiting.  You have a severe headache.  Your fever or chills gets worse. This information is not intended to replace advice given to you by your health care provider. Make sure you discuss any questions you have with your health care provider. Document Released: 09/02/2004 Document Revised: 03/09/2017 Document Reviewed: 01/14/2016 Elsevier Interactive Patient Education  2019 [employer].       Visit summary with medication list and pertinent instructions was printed for patient to review. All  questions at time of visit were answered - patient instructed to contact office with any additional concerns or updates. ER/RTC precautions were reviewed with the patient.   Follow-up plan: Return if symptoms worsen or fail to improve.  Please note: voice recognition software was used to produce this document, and typos may escape review. Please contact Dr. Sheppard Coil for any needed clarifications.

## 2018-09-19 ENCOUNTER — Telehealth: Payer: Self-pay | Admitting: Osteopathic Medicine

## 2018-09-19 NOTE — Telephone Encounter (Signed)
Attempted to contact pt. No answer, left a detailed vm msg to stop by imaging for chest x-ray. Direct call back info provided.

## 2018-09-19 NOTE — Telephone Encounter (Signed)
It does not look like patient got her chest x-ray done yesterday, can we call her and remind her to do this?

## 2018-09-20 NOTE — Telephone Encounter (Signed)
Pt's husband called stating that pt is a little better, but continues coughing. Returned a call back, spoke with pt. Informed pt that a chest x-ray was ordered on the day of her appt. Pt has agreed to come in on 09/21/18 to complete chest x-ray. No other inquiries during call.

## 2018-09-21 ENCOUNTER — Ambulatory Visit (HOSPITAL_BASED_OUTPATIENT_CLINIC_OR_DEPARTMENT_OTHER): Admission: RE | Admit: 2018-09-21 | Payer: Medicare Other | Source: Ambulatory Visit

## 2018-09-21 ENCOUNTER — Ambulatory Visit (INDEPENDENT_AMBULATORY_CARE_PROVIDER_SITE_OTHER): Payer: Medicare Other

## 2018-09-21 ENCOUNTER — Other Ambulatory Visit: Payer: Self-pay | Admitting: Osteopathic Medicine

## 2018-09-21 DIAGNOSIS — R093 Abnormal sputum: Secondary | ICD-10-CM | POA: Diagnosis not present

## 2018-09-21 DIAGNOSIS — R05 Cough: Secondary | ICD-10-CM | POA: Diagnosis not present

## 2018-09-21 DIAGNOSIS — R059 Cough, unspecified: Secondary | ICD-10-CM

## 2018-09-26 ENCOUNTER — Other Ambulatory Visit: Payer: Self-pay | Admitting: Osteopathic Medicine

## 2018-09-26 DIAGNOSIS — E039 Hypothyroidism, unspecified: Secondary | ICD-10-CM

## 2018-10-11 ENCOUNTER — Other Ambulatory Visit: Payer: Self-pay | Admitting: Osteopathic Medicine

## 2018-10-11 DIAGNOSIS — F419 Anxiety disorder, unspecified: Secondary | ICD-10-CM

## 2018-10-11 NOTE — Telephone Encounter (Signed)
Please review for refill- patient at [facility] 

## 2019-03-10 ENCOUNTER — Other Ambulatory Visit: Payer: Self-pay | Admitting: Osteopathic Medicine

## 2019-03-10 DIAGNOSIS — E039 Hypothyroidism, unspecified: Secondary | ICD-10-CM

## 2019-03-12 ENCOUNTER — Other Ambulatory Visit: Payer: Self-pay | Admitting: Osteopathic Medicine

## 2019-03-12 DIAGNOSIS — E039 Hypothyroidism, unspecified: Secondary | ICD-10-CM

## 2019-03-12 DIAGNOSIS — K219 Gastro-esophageal reflux disease without esophagitis: Secondary | ICD-10-CM

## 2019-04-15 ENCOUNTER — Other Ambulatory Visit: Payer: Self-pay | Admitting: Osteopathic Medicine

## 2019-04-15 DIAGNOSIS — E039 Hypothyroidism, unspecified: Secondary | ICD-10-CM

## 2019-04-17 ENCOUNTER — Other Ambulatory Visit: Payer: Self-pay | Admitting: Osteopathic Medicine

## 2019-04-17 DIAGNOSIS — E039 Hypothyroidism, unspecified: Secondary | ICD-10-CM

## 2019-04-17 NOTE — Progress Notes (Deleted)
Subjective:   Angela Myers is a 78 y.o. female who presents for Medicare Annual (Subsequent) preventive examination.  Review of Systems:  No ROS.  Medicare Wellness Virtual Visit.  Visual/audio telehealth visit, UTA vital signs.   See social history for additional risk factors.      Sleep patterns:    Home Safety/Smoke Alarms: Feels safe in home. Smoke alarms in place.  Living environment;  Seat Belt Safety/Bike Helmet: Wears seat belt.   Female:   Pap- Aged out      Mammo-  Aged out     Dexa scan-        CCS- Aged out      Objective:     Vitals: There were no vitals taken for this visit.  There is no height or weight on file to calculate BMI.  Advanced Directives 04/17/2018 11/24/2016  Does Patient Have a Medical Advance Directive? No No  Would patient like information on creating a medical advance directive? Yes (MAU/Ambulatory/Procedural Areas - Information given) -    Tobacco Social History   Tobacco Use  Smoking Status Former Smoker  . Packs/day: 1.00  . Years: 15.00  . Pack years: 15.00  Smokeless Tobacco Never Used     Counseling given: Not Answered   Clinical Intake:                       Past Medical History:  Diagnosis Date  . Thyroid disease    No past surgical history on file. Family History  Problem Relation Age of Onset  . Diabetes Maternal Aunt   . Heart attack Maternal Aunt    Social History   Socioeconomic History  . Marital status: Married    Spouse name: [name]  . Number of children: 3  . Years of education: high school   . Highest education level: 12th grade  Occupational History  . Occupation: retired    Comment: Tree surgeon  . Financial resource strain: Not hard at all  . Food insecurity    Worry: Never true    Inability: Never true  . Transportation needs    Medical: No    Non-medical: No  Tobacco Use  . Smoking status: Former Smoker    Packs/day: 1.00    Years: 15.00    Pack years:  15.00  . Smokeless tobacco: Never Used  Substance and Sexual Activity  . Alcohol use: Yes    Alcohol/week: 1.0 standard drinks    Types: 1 Glasses of wine per week    Comment: socially  . Drug use: Never  . Sexual activity: Yes  Lifestyle  . Physical activity    Days per week: 7 days    Minutes per session: 20 min  . Stress: Not at all  Relationships  . Social Herbalist on phone: Twice a week    Gets together: Twice a week    Attends religious service: Never    Active member of club or organization: No    Attends meetings of clubs or organizations: Never    Relationship status: Married  Other Topics Concern  . Not on file  Social History Narrative   Lives with husband and dog. Gets out with family a lot.    Outpatient Encounter Medications as of 04/23/2019  Medication Sig  . aspirin EC 81 MG tablet Take by mouth.  Marland Kitchen azithromycin (ZITHROMAX) 250 MG tablet 2 tabs po on Day 1, then 1 tab  daily Days 2 - 5  . cyanocobalamin (V-R VITAMIN B-12) 500 MCG tablet Take by mouth.  . Ipratropium-Albuterol (COMBIVENT) 20-100 MCG/ACT AERS respimat Inhale 1 puff into the lungs every 6 (six) hours as needed for wheezing or shortness of breath.  . levothyroxine (SYNTHROID) 75 MCG tablet TAKE 1 TABLET BY MOUTH DAILY BEFORE BREAKFAST  . Nebulizer MISC Nebulizer machine and necessary tubing and other supplies per patient preference/insurance coverage. Dx: J18.1, R05  . omeprazole (PRILOSEC) 20 MG capsule TAKE 1 CAPSULE BY MOUTH  DAILY  . PARoxetine (PAXIL) 40 MG tablet TAKE 1 TABLET BY MOUTH  DAILY  . predniSONE (DELTASONE) 20 MG tablet Take 1 tablet (20 mg total) by mouth 2 (two) times daily with a meal.  . Pumpkin Seed-Soy Germ (AZO BLADDER CONTROL/GO-LESS) CAPS Take by mouth.  . simvastatin (ZOCOR) 40 MG tablet TAKE 1 TABLET BY MOUTH AT  BEDTIME   No facility-administered encounter medications on file as of 04/23/2019.     Activities of Daily Living In your present state of  health, do you have any difficulty performing the following activities: 04/17/2018  Hearing? N  Vision? N  Difficulty concentrating or making decisions? N  Comment does the same thing everyday  Walking or climbing stairs? N  Dressing or bathing? N  Doing errands, shopping? N  Preparing Food and eating ? N  Using the Toilet? N  In the past six months, have you accidently leaked urine? N  Do you have problems with loss of bowel control? N  Managing your Medications? N  Managing your Finances? N  Housekeeping or managing your Housekeeping? N  Some recent data might be hidden    Patient Care Team: Emeterio Reeve, DO as PCP - General (Osteopathic Medicine)    Assessment:   This is a routine wellness examination for [place].Physical assessment deferred to PCP.   Exercise Activities and Dietary recommendations   Diet  Breakfast: Lunch:  Dinner:       Goals    . DIET - INCREASE WATER INTAKE       Fall Risk Fall Risk  04/17/2018 11/25/2016  Falls in the past year? No No   Is the patient's home free of loose throw rugs in walkways, pet beds, electrical cords, etc?   {Blank single:19197::"yes","no"}      Grab bars in the bathroom? {Blank single:19197::"yes","no"}      Handrails on the stairs?   {Blank single:19197::"yes","no"}      Adequate lighting?   {Blank single:19197::"yes","no"}   Depression Screen PHQ 2/9 Scores 07/13/2018 04/17/2018 12/26/2017 11/24/2016  PHQ - 2 Score 0 0 0 0  PHQ- 9 Score 0 - 0 -     Cognitive Function MMSE - Mini Mental State Exam 04/17/2018  Orientation to time 5  Orientation to Place 5  Registration 3  Attention/ Calculation 5  Recall 0  Language- name 2 objects 2  Language- repeat 1  Language- follow 3 step command 3  Language- read & follow direction 1  Write a sentence 1  Copy design 1  Total score 27     6CIT Screen 02/24/2017  What Year? 0 points  What month? 0 points  What time? 3 points  Count back from 20 0 points  Months in  reverse 0 points  Repeat phrase 8 points  Total Score 11    Immunization History  Administered Date(s) Administered  . H1N1 07/18/2008  . Influenza, High Dose Seasonal PF 05/30/2017, 04/26/2018  . Influenza-Unspecified 04/16/2010, 05/16/2013, 06/03/2014, 10/13/2015,  05/06/2016  . Pneumococcal Conjugate-13 11/13/2015  . Pneumococcal Polysaccharide-23 02/16/2007    Screening Tests Health Maintenance  Topic Date Due  . TETANUS/TDAP  02/17/1961  . INFLUENZA VACCINE  03/10/2019  . DEXA SCAN  Completed  . PNA vac Low Risk Adult  Discontinued       Plan:   ***   I have personally reviewed and noted the following in the patient's chart:   . Medical and social history . Use of alcohol, tobacco or illicit drugs  . Current medications and supplements . Functional ability and status . Nutritional status . Physical activity . Advanced directives . List of other physicians . Hospitalizations, surgeries, and ER visits in previous 12 months . Vitals . Screenings to include cognitive, depression, and falls . Referrals and appointments  In addition, I have reviewed and discussed with patient certain preventive protocols, quality metrics, and best practice recommendations. A written personalized care plan for preventive services as well as general preventive health recommendations were provided to patient.     Joanne Chars, LPN  [id number]

## 2019-04-19 ENCOUNTER — Other Ambulatory Visit: Payer: Self-pay | Admitting: Osteopathic Medicine

## 2019-04-19 DIAGNOSIS — E039 Hypothyroidism, unspecified: Secondary | ICD-10-CM

## 2019-04-19 NOTE — Telephone Encounter (Signed)
Requested medication (s) are due for refill today: yes  Requested medication (s) are on the active medication list: yes  Last refill:  04/15/2019  Future visit scheduled: yes  Notes to clinic:Patient requests 90 days supply    Requested Prescriptions  Pending Prescriptions Disp Refills   levothyroxine (SYNTHROID) 75 MCG tablet [Pharmacy Med Name: LEVOTHYROXINE 0.075MG  (75MCG) TABS] 90 tablet     Sig: TAKE 1 TABLET BY MOUTH DAILY BEFORE BREAKFAST     Endocrinology:  Hypothyroid Agents Failed - 04/19/2019  8:03 AM      Failed - TSH needs to be rechecked within 3 months after an abnormal result. Refill until TSH is due.      Failed - TSH in normal range and within 360 days    TSH  Date Value Ref Range Status  09/13/2018 0.08 (L) 0.40 - 4.50 mIU/L Final         Failed - Valid encounter within last 12 months    Recent Outpatient Visits          7 months ago Cough   [facility], Lanelle Bal, DO   9 months ago Annual physical exam   [facility], Lanelle Bal, DO   9 months ago Primary osteoarthritis of right hip   [facility], Gwen Her, MD   11 months ago Acute bronchitis due to other specified organisms   [facility], Gwen Her, MD   1 year ago Medicare annual wellness visit, subsequent   [place], Lanelle Bal, DO      Future Appointments            In 4 days [place]

## 2019-04-23 ENCOUNTER — Ambulatory Visit (INDEPENDENT_AMBULATORY_CARE_PROVIDER_SITE_OTHER): Payer: Medicare Other | Admitting: *Deleted

## 2019-04-23 ENCOUNTER — Ambulatory Visit: Payer: Medicare Other

## 2019-04-23 ENCOUNTER — Other Ambulatory Visit: Payer: Self-pay

## 2019-04-23 VITALS — BP 130/49 | HR 69 | Ht 64.0 in | Wt 126.0 lb

## 2019-04-23 DIAGNOSIS — Z0000 Encounter for general adult medical examination without abnormal findings: Secondary | ICD-10-CM

## 2019-04-23 NOTE — Progress Notes (Signed)
Subjective:   Angela Myers is a 78 y.o. female who presents for Medicare Annual (Subsequent) preventive examination.  Review of Systems:  No ROS.  Medicare Wellness Virtual Visit.  Visual/audio telehealth visit, UTA vital signs.   See social history for additional risk factors.    Cardiac Risk Factors include: dyslipidemia Sleep patterns: Getting 7-8 hours of sleep a night. Does not wake up to go to the bathroom. Wakes up and feels refreshed and ready for the day.  Home Safety/Smoke Alarms: Feels safe in home. Smoke alarms in place.  Living environment; Lives with husband in  1 story home with full basement and stairss have handrails on them. Shower is a walk in shower and grab bars are in place. Seat Belt Safety/Bike Helmet: Wears seat belt.   Female:   Pap-   Aged out    Mammo- Aged out       Dexa scan-  UTD      CCS- Aged Out      Objective:     Vitals: There were no vitals taken for this visit.  There is no height or weight on file to calculate BMI.  Advanced Directives 04/23/2019 04/17/2018 11/24/2016  Does Patient Have a Medical Advance Directive? Yes No No  Type of Paramedic of [place];Living will - -  Does patient want to make changes to medical advance directive? No - Patient declined - -  Copy of [place] in Chart? No - copy requested - -  Would patient like information on creating a medical advance directive? - Yes (MAU/Ambulatory/Procedural Areas - Information given) -    Tobacco Social History   Tobacco Use  Smoking Status Former Smoker  . Packs/day: 1.00  . Years: 15.00  . Pack years: 15.00  Smokeless Tobacco Never Used     Counseling given: Not Answered   Clinical Intake:  Pre-visit preparation completed: Yes  Pain : No/denies pain     Nutritional Risks: None Diabetes: No  What is the last grade level you completed in school?: 12  Interpreter Needed?: No  Information entered by :: Anette Guarneri, LPN  Past Medical History:  Diagnosis Date  . Thyroid disease    History reviewed. No pertinent surgical history. Family History  Problem Relation Age of Onset  . Diabetes Maternal Aunt   . Heart attack Maternal Aunt    Social History   Socioeconomic History  . Marital status: Married    Spouse name: [name]  . Number of children: 3  . Years of education: high school   . Highest education level: 12th grade  Occupational History  . Occupation: retired    Comment: Tree surgeon  . Financial resource strain: Not hard at all  . Food insecurity    Worry: Never true    Inability: Never true  . Transportation needs    Medical: No    Non-medical: No  Tobacco Use  . Smoking status: Former Smoker    Packs/day: 1.00    Years: 15.00    Pack years: 15.00  . Smokeless tobacco: Never Used  Substance and Sexual Activity  . Alcohol use: Yes    Alcohol/week: 1.0 standard drinks    Types: 1 Glasses of wine per week    Comment: socially  . Drug use: Never  . Sexual activity: Yes  Lifestyle  . Physical activity    Days per week: 3 days    Minutes per session: 20 min  .  Stress: Not at all  Relationships  . Social connections    Talks on phone: More than three times a week    Gets together: Once a week    Attends religious service: More than 4 times per year    Active member of club or organization: No    Attends meetings of clubs or organizations: Never    Relationship status: Married  Other Topics Concern  . Not on file  Social History Narrative   Lives with husband and dog. Gets out with family a lot. Walks for exercise    Outpatient Encounter Medications as of 04/23/2019  Medication Sig  . aspirin EC 81 MG tablet Take by mouth.  . cyanocobalamin (V-R VITAMIN B-12) 500 MCG tablet Take by mouth.  . Ipratropium-Albuterol (COMBIVENT) 20-100 MCG/ACT AERS respimat Inhale 1 puff into the lungs every 6 (six) hours as needed for wheezing or shortness of breath.   . levothyroxine (SYNTHROID) 75 MCG tablet TAKE 1 TABLET BY MOUTH DAILY BEFORE BREAKFAST  . Nebulizer MISC Nebulizer machine and necessary tubing and other supplies per patient preference/insurance coverage. Dx: J18.1, R05  . omeprazole (PRILOSEC) 20 MG capsule TAKE 1 CAPSULE BY MOUTH  DAILY  . PARoxetine (PAXIL) 40 MG tablet TAKE 1 TABLET BY MOUTH  DAILY  . simvastatin (ZOCOR) 40 MG tablet TAKE 1 TABLET BY MOUTH AT  BEDTIME  . predniSONE (DELTASONE) 20 MG tablet Take 1 tablet (20 mg total) by mouth 2 (two) times daily with a meal. (Patient not taking: Reported on 04/23/2019)  . Pumpkin Seed-Soy Germ (AZO BLADDER CONTROL/GO-LESS) CAPS Take by mouth.  . [DISCONTINUED] azithromycin (ZITHROMAX) 250 MG tablet 2 tabs po on Day 1, then 1 tab daily Days 2 - 5   No facility-administered encounter medications on file as of 04/23/2019.     Activities of Daily Living In your present state of health, do you have any difficulty performing the following activities: 04/23/2019  Hearing? N  Vision? N  Difficulty concentrating or making decisions? N  Walking or climbing stairs? N  Dressing or bathing? N  Doing errands, shopping? N  Preparing Food and eating ? N  Using the Toilet? N  In the past six months, have you accidently leaked urine? N  Do you have problems with loss of bowel control? N  Managing your Medications? N  Managing your Finances? N  Housekeeping or managing your Housekeeping? N  Some recent data might be hidden    Patient Care Team: Emeterio Reeve, DO as PCP - General (Osteopathic Medicine)    Assessment:   This is a routine wellness examination for [place].Physical assessment deferred to PCP.   Exercise Activities and Dietary recommendations Current Exercise Habits: Home exercise routine, Type of exercise: walking, Time (Minutes): 20, Frequency (Times/Week): 3, Weekly Exercise (Minutes/Week): 60, Intensity: Mild, Exercise limited by: None identified Diet  Eats a healthy diet  of vegetables and fruits and protein. Breakfast: cereal Lunch: sandwich Dinner: Meat and vegetables Drinking 2 glasses of water a day      Goals    . DIET - INCREASE WATER INTAKE    . Exercise 3x per week (30 min per time)     Exercise at least 3 times a week for 30 minutes at a time.       Fall Risk Fall Risk  04/23/2019 04/17/2018 11/25/2016  Falls in the past year? 0 No No  Number falls in past yr: 0 - -  Injury with Fall? 0 - -  Follow  up Falls prevention discussed - -   Is the patient's home free of loose throw rugs in walkways, pet beds, electrical cords, etc?   yes      Grab bars in the bathroom? yes      Handrails on the stairs?   yes      Adequate lighting?   yes    Depression Screen PHQ 2/9 Scores 04/23/2019 07/13/2018 04/17/2018 12/26/2017  PHQ - 2 Score 0 0 0 0  PHQ- 9 Score - 0 - 0     Cognitive Function MMSE - Mini Mental State Exam 04/17/2018  Orientation to time 5  Orientation to Place 5  Registration 3  Attention/ Calculation 5  Recall 0  Language- name 2 objects 2  Language- repeat 1  Language- follow 3 step command 3  Language- read & follow direction 1  Write a sentence 1  Copy design 1  Total score 27     6CIT Screen 04/23/2019 02/24/2017  What Year? 4 points 0 points  What month? 3 points 0 points  What time? 0 points 3 points  Count back from 20 0 points 0 points  Months in reverse 4 points 0 points  Repeat phrase 10 points 8 points  Total Score 21 11    Immunization History  Administered Date(s) Administered  . H1N1 07/18/2008  . Influenza, High Dose Seasonal PF 05/30/2017, 04/26/2018  . Influenza-Unspecified 04/16/2010, 05/16/2013, 06/03/2014, 10/13/2015, 05/06/2016  . Pneumococcal Conjugate-13 11/13/2015  . Pneumococcal Polysaccharide-23 02/16/2007    Screening Tests Health Maintenance  Topic Date Due  . TETANUS/TDAP  02/17/1961  . INFLUENZA VACCINE  03/10/2019  . DEXA SCAN  Completed  . PNA vac Low Risk Adult  Discontinued        Plan:    Ms. Salyards , Thank you for taking time to come for your Medicare Wellness Visit. I appreciate your ongoing commitment to your health goals. Please review the following plan we discussed and let me know if I can assist you in the future. Please schedule your next medicare wellness visit with me in 1 yr. Continue doing brain stimulating activities (puzzles, reading, adult coloring books, staying active) to keep memory sharp.     These are the goals we discussed: Goals    . DIET - INCREASE WATER INTAKE    . Exercise 3x per week (30 min per time)     Exercise at least 3 times a week for 30 minutes at a time.       This is a list of the screening recommended for you and due dates:  Health Maintenance  Topic Date Due  . Tetanus Vaccine  02/17/1961  . Flu Shot  03/10/2019  . DEXA scan (bone density measurement)  Completed  . Pneumonia vaccines  Discontinued     I have personally reviewed and noted the following in the patient's chart:   . Medical and social history . Use of alcohol, tobacco or illicit drugs  . Current medications and supplements . Functional ability and status . Nutritional status . Physical activity . Advanced directives . List of other physicians . Hospitalizations, surgeries, and ER visits in previous 12 months . Vitals . Screenings to include cognitive, depression, and falls . Referrals and appointments  In addition, I have reviewed and discussed with patient certain preventive protocols, quality metrics, and best practice recommendations. A written personalized care plan for preventive services as well as general preventive health recommendations were provided to patient.  Joanne Chars, LPN  [id number]

## 2019-04-23 NOTE — Patient Instructions (Signed)
Angela Myers , Thank you for taking time to come for your Medicare Wellness Visit. I appreciate your ongoing commitment to your health goals. Please review the following plan we discussed and let me know if I can assist you in the future. Please schedule your next medicare wellness visit with me in 1 yr. Continue doing brain stimulating activities (puzzles, reading, adult coloring books, staying active) to keep memory sharp.    These are the goals we discussed: Goals    . DIET - INCREASE WATER INTAKE    . Exercise 3x per week (30 min per time)     Exercise at least 3 times a week for 30 minutes at a time.

## 2019-05-15 ENCOUNTER — Other Ambulatory Visit: Payer: Self-pay | Admitting: Osteopathic Medicine

## 2019-05-15 DIAGNOSIS — F419 Anxiety disorder, unspecified: Secondary | ICD-10-CM

## 2019-05-16 NOTE — Telephone Encounter (Signed)
Requested medication (s) are due for refill today: yes  Requested medication (s) are on the active medication list: yes  Last refill:  03/21/2019  Future visit scheduled: no  Notes to clinic:  Requesting 1 year supply   Requested Prescriptions  Pending Prescriptions Disp Refills   PARoxetine (PAXIL) 40 MG tablet [Pharmacy Med Name: PAROXETINE  40MG   TAB] 90 tablet 3    Sig: TAKE 1 TABLET BY MOUTH  DAILY     Psychiatry:  Antidepressants - SSRI Failed - 05/15/2019  9:48 PM      Failed - Completed PHQ-2 or PHQ-9 in the last 360 days.      Passed - Valid encounter within last 6 months    Recent Outpatient Visits          8 months ago Cough   [place], Lanelle Bal, DO   10 months ago Annual physical exam   [place], Lanelle Bal, DO   10 months ago Primary osteoarthritis of right hip   [facility], Gwen Her, MD   11 months ago Acute bronchitis due to other specified organisms   [facility] [place] [facility], Gwen Her, MD   1 year ago Medicare annual wellness visit, subsequent   [facility], Lanelle Bal, DO      Future Appointments            In 11 months [facility]

## 2019-05-16 NOTE — Telephone Encounter (Signed)
Patient last was seen in the clinic for cough on 09/2018. She seent kim last month for a medicare wellness. Please advise.

## 2019-06-15 ENCOUNTER — Telehealth: Payer: Self-pay

## 2019-06-15 ENCOUNTER — Other Ambulatory Visit: Payer: Self-pay

## 2019-06-15 DIAGNOSIS — E039 Hypothyroidism, unspecified: Secondary | ICD-10-CM

## 2019-06-15 MED ORDER — LEVOTHYROXINE SODIUM 75 MCG PO TABS: ORAL_TABLET | ORAL | 0 refills | Status: DC

## 2019-06-15 NOTE — Telephone Encounter (Signed)
Agree, I double check that orders are in place, patient should be able to just go directly to the lab

## 2019-06-15 NOTE — Telephone Encounter (Signed)
Received a [facility] fax med refill request for levothryroxine 75 mcg. Attempted to contact pt at both listed number. Left a vm msg on mobile number. Pt aware she is due to repeat thyroid test. Refill sent for #30 to [facility]. Pt advised of lab hours/schedule.

## 2019-07-16 ENCOUNTER — Telehealth: Payer: Self-pay

## 2019-07-16 ENCOUNTER — Other Ambulatory Visit: Payer: Self-pay

## 2019-07-16 DIAGNOSIS — E039 Hypothyroidism, unspecified: Secondary | ICD-10-CM

## 2019-07-16 MED ORDER — LEVOTHYROXINE SODIUM 75 MCG PO TABS: ORAL_TABLET | ORAL | 0 refills | Status: DC

## 2019-07-16 NOTE — Telephone Encounter (Signed)
Sending as an FYI to provider -   Received faxed med refill request for levothyroxine from [place]. Contacted pt, aware thyroid check is due. Pt provided with lab hrs/schedule. Pt is agreeable with having labs drawn. Aware #30 of rx sent to [facility] with no refill. No other inquiries during call.

## 2019-07-20 DIAGNOSIS — E039 Hypothyroidism, unspecified: Secondary | ICD-10-CM | POA: Diagnosis not present

## 2019-07-21 LAB — TSH: TSH: 0.4 mIU/L (ref 0.40–4.50)

## 2019-07-23 ENCOUNTER — Encounter: Payer: Self-pay | Admitting: Osteopathic Medicine

## 2019-07-23 ENCOUNTER — Other Ambulatory Visit: Payer: Self-pay | Admitting: Osteopathic Medicine

## 2019-07-23 DIAGNOSIS — E039 Hypothyroidism, unspecified: Secondary | ICD-10-CM

## 2019-07-23 MED ORDER — LEVOTHYROXINE SODIUM 75 MCG PO TABS: ORAL_TABLET | ORAL | 3 refills | Status: DC

## 2019-07-23 MED ORDER — LEVOTHYROXINE SODIUM 75 MCG PO TABS: ORAL_TABLET | ORAL | 0 refills | Status: DC

## 2019-12-22 DIAGNOSIS — S199XXA Unspecified injury of neck, initial encounter: Secondary | ICD-10-CM | POA: Diagnosis not present

## 2019-12-22 DIAGNOSIS — S0993XA Unspecified injury of face, initial encounter: Secondary | ICD-10-CM | POA: Diagnosis not present

## 2019-12-22 DIAGNOSIS — Z79899 Other long term (current) drug therapy: Secondary | ICD-10-CM | POA: Diagnosis not present

## 2019-12-22 DIAGNOSIS — Z87891 Personal history of nicotine dependence: Secondary | ICD-10-CM | POA: Diagnosis not present

## 2019-12-22 DIAGNOSIS — Z888 Allergy status to other drugs, medicaments and biological substances status: Secondary | ICD-10-CM | POA: Diagnosis not present

## 2019-12-22 DIAGNOSIS — K219 Gastro-esophageal reflux disease without esophagitis: Secondary | ICD-10-CM | POA: Diagnosis not present

## 2019-12-22 DIAGNOSIS — E039 Hypothyroidism, unspecified: Secondary | ICD-10-CM | POA: Diagnosis not present

## 2019-12-22 DIAGNOSIS — G44319 Acute post-traumatic headache, not intractable: Secondary | ICD-10-CM | POA: Diagnosis not present

## 2019-12-22 DIAGNOSIS — R220 Localized swelling, mass and lump, head: Secondary | ICD-10-CM | POA: Diagnosis not present

## 2019-12-22 DIAGNOSIS — Z7982 Long term (current) use of aspirin: Secondary | ICD-10-CM | POA: Diagnosis not present

## 2019-12-22 DIAGNOSIS — S0003XA Contusion of scalp, initial encounter: Secondary | ICD-10-CM | POA: Diagnosis not present

## 2019-12-22 DIAGNOSIS — Z8673 Personal history of transient ischemic attack (TIA), and cerebral infarction without residual deficits: Secondary | ICD-10-CM | POA: Diagnosis not present

## 2019-12-22 DIAGNOSIS — S0083XA Contusion of other part of head, initial encounter: Secondary | ICD-10-CM | POA: Diagnosis not present

## 2019-12-22 DIAGNOSIS — E785 Hyperlipidemia, unspecified: Secondary | ICD-10-CM | POA: Diagnosis not present

## 2020-01-29 ENCOUNTER — Other Ambulatory Visit: Payer: Self-pay | Admitting: Osteopathic Medicine

## 2020-01-29 DIAGNOSIS — K219 Gastro-esophageal reflux disease without esophagitis: Secondary | ICD-10-CM

## 2020-04-07 ENCOUNTER — Other Ambulatory Visit: Payer: Self-pay | Admitting: Osteopathic Medicine

## 2020-04-07 DIAGNOSIS — F419 Anxiety disorder, unspecified: Secondary | ICD-10-CM

## 2020-04-08 NOTE — Telephone Encounter (Signed)
Medication refill request  Last refill 01/14/2020 Last Ov- 09/18/18

## 2020-04-23 ENCOUNTER — Ambulatory Visit: Payer: Medicare Other

## 2020-05-11 ENCOUNTER — Other Ambulatory Visit: Payer: Self-pay | Admitting: Osteopathic Medicine

## 2020-05-11 DIAGNOSIS — E039 Hypothyroidism, unspecified: Secondary | ICD-10-CM

## 2020-07-20 IMAGING — DX DG CHEST 2V
2 series · 2 of 2 positions shown · non-contrast
Comparison: None.

CLINICAL DATA: Acute bronchitis, persistent cough

EXAM:
CHEST - 2 VIEW

[chest pa]
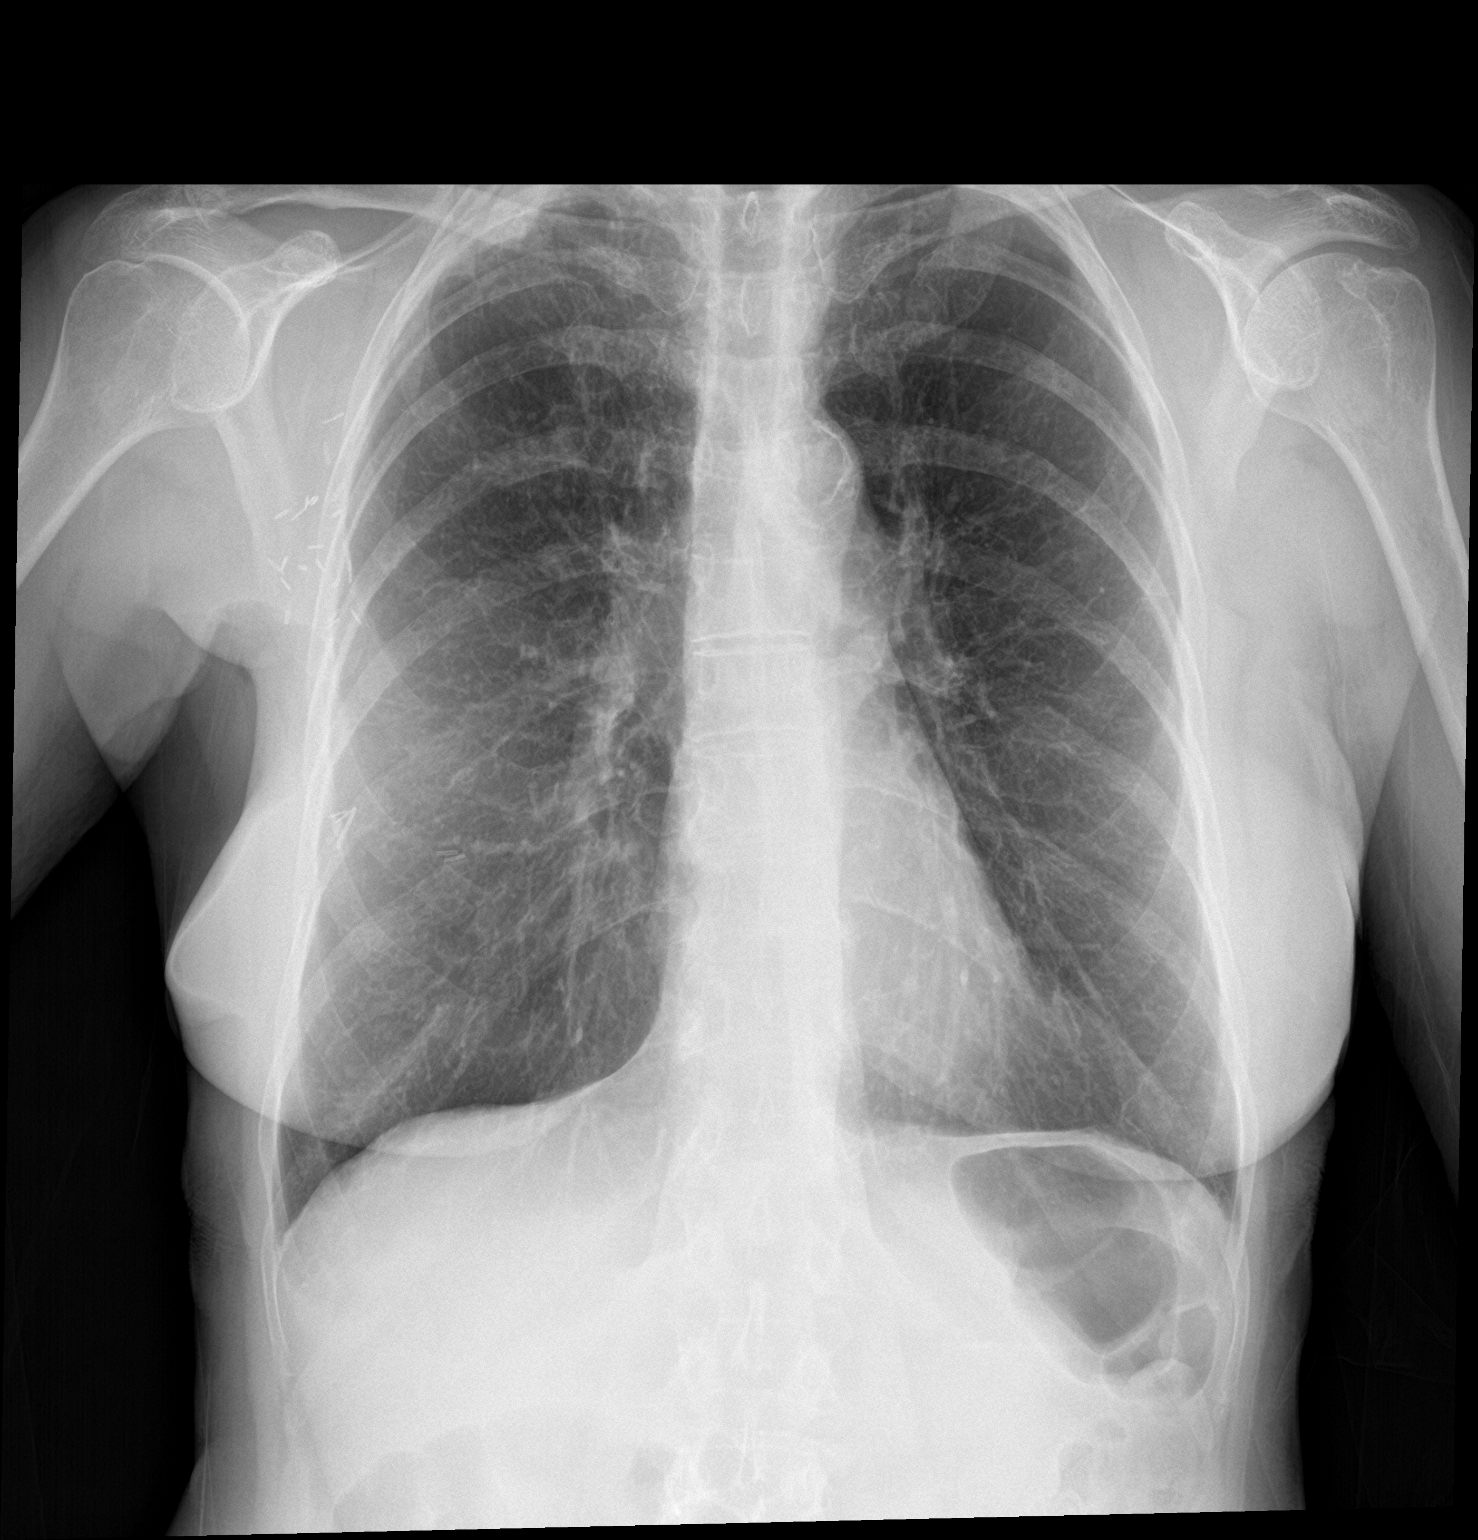

[chest lat]
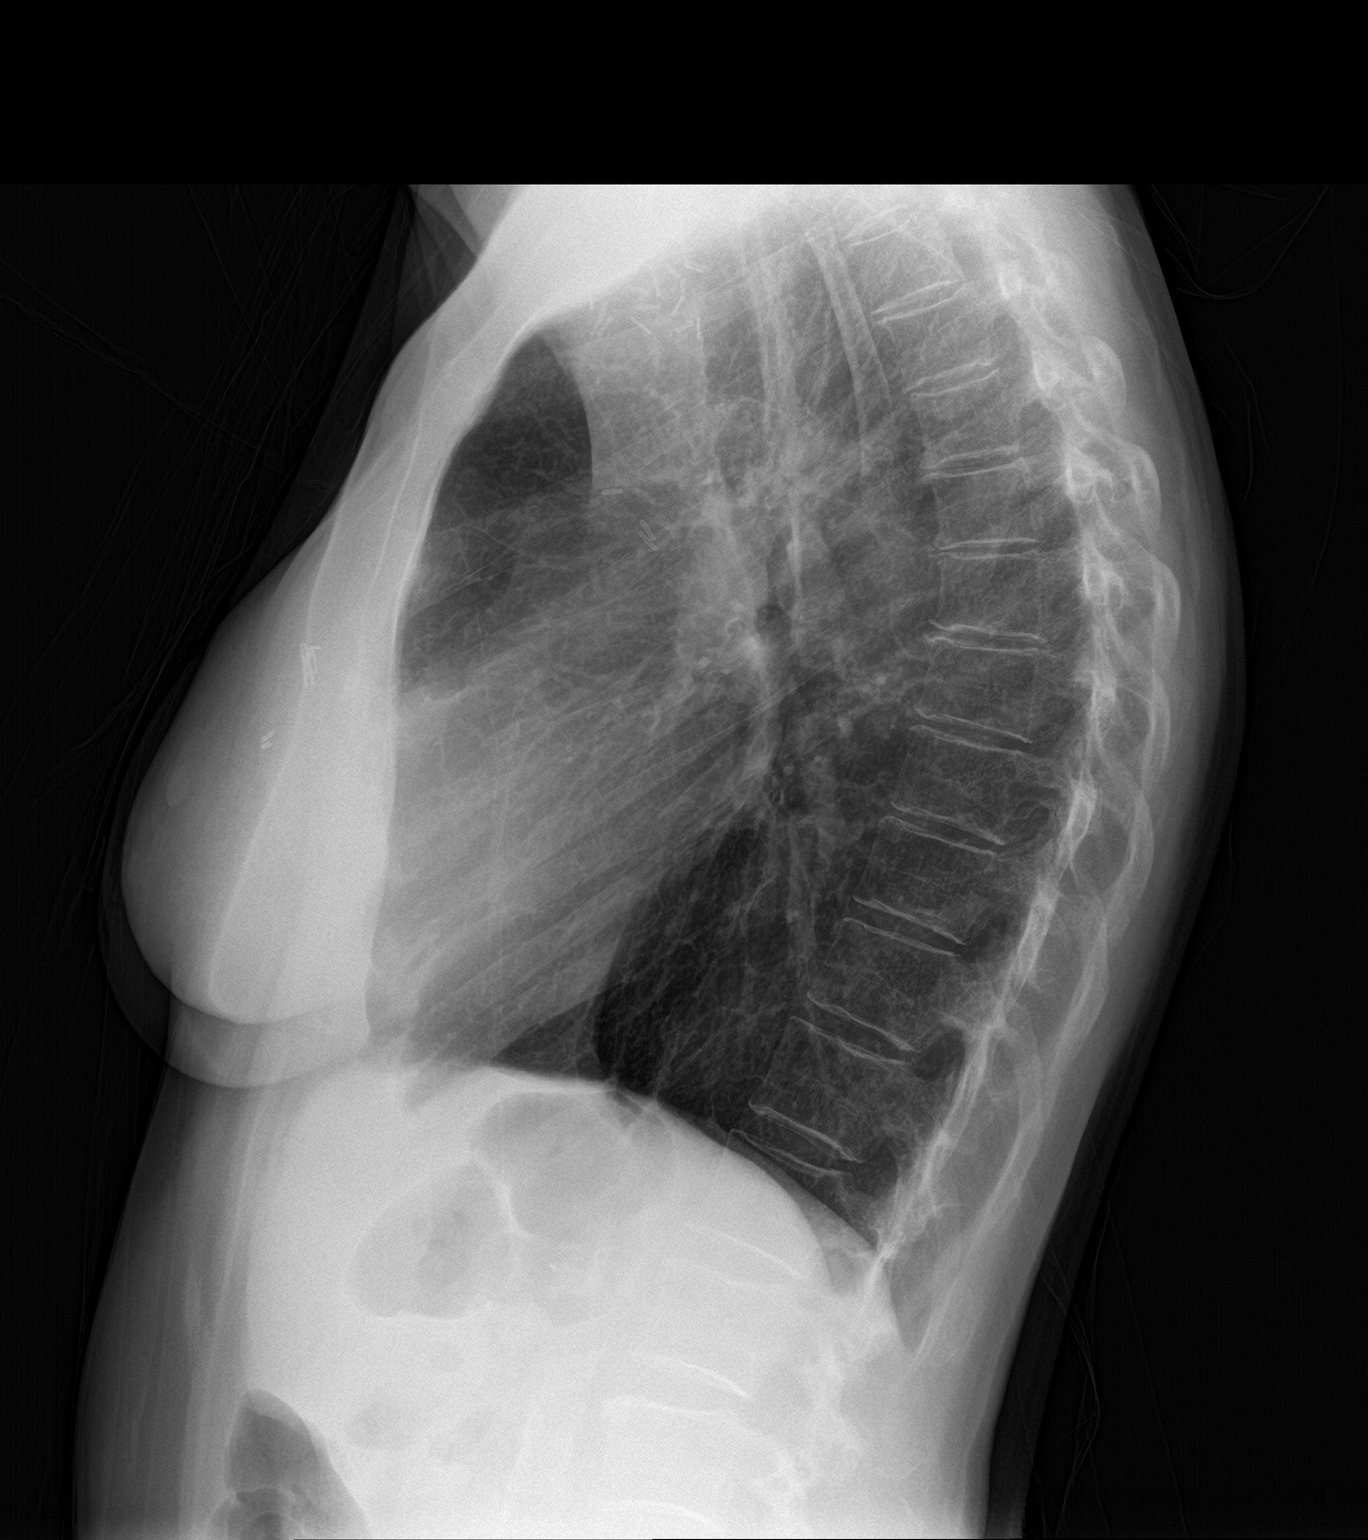

[2 of 2 positions shown; findings below may reference images not displayed]

FINDINGS: Normal heart size. Atherosclerotic aortic arch. Otherwise normal
mediastinal contour. No pneumothorax. No pleural effusion. Lungs
appear clear, with no acute consolidative airspace disease and no
pulmonary edema. Surgical clips overlie the right axilla.
IMPRESSION: No active cardiopulmonary disease.

## 2020-12-31 ENCOUNTER — Other Ambulatory Visit: Payer: Self-pay | Admitting: Osteopathic Medicine

## 2020-12-31 DIAGNOSIS — K219 Gastro-esophageal reflux disease without esophagitis: Secondary | ICD-10-CM

## 2021-02-22 ENCOUNTER — Other Ambulatory Visit: Payer: Self-pay | Admitting: Osteopathic Medicine

## 2021-02-22 DIAGNOSIS — K219 Gastro-esophageal reflux disease without esophagitis: Secondary | ICD-10-CM

## 2021-03-11 ENCOUNTER — Other Ambulatory Visit: Payer: Self-pay | Admitting: Osteopathic Medicine

## 2021-03-11 DIAGNOSIS — F419 Anxiety disorder, unspecified: Secondary | ICD-10-CM

## 2021-04-12 ENCOUNTER — Other Ambulatory Visit: Payer: Self-pay | Admitting: Osteopathic Medicine

## 2021-04-12 DIAGNOSIS — E039 Hypothyroidism, unspecified: Secondary | ICD-10-CM

## 2021-05-15 ENCOUNTER — Other Ambulatory Visit: Payer: Self-pay | Admitting: Osteopathic Medicine

## 2021-05-15 DIAGNOSIS — E039 Hypothyroidism, unspecified: Secondary | ICD-10-CM

## 2021-05-29 ENCOUNTER — Other Ambulatory Visit: Payer: Self-pay | Admitting: Osteopathic Medicine

## 2021-06-18 ENCOUNTER — Other Ambulatory Visit: Payer: Self-pay | Admitting: Family Medicine

## 2021-06-18 NOTE — Telephone Encounter (Signed)
Attempted to reach patient on numbers in chart, one just rang and then beeped and the other just rang. Will attempt again later. AM

## 2021-06-18 NOTE — Telephone Encounter (Signed)
Please contact patient to schedule appointment. Not seen since 2020.   Unable to provide refills

## 2021-06-18 NOTE — Telephone Encounter (Signed)
Attempted to reach patient again, no success with either number.

## 2021-06-24 ENCOUNTER — Other Ambulatory Visit: Payer: Self-pay | Admitting: Osteopathic Medicine

## 2021-06-24 ENCOUNTER — Other Ambulatory Visit: Payer: Self-pay | Admitting: Family Medicine

## 2021-06-24 DIAGNOSIS — K219 Gastro-esophageal reflux disease without esophagitis: Secondary | ICD-10-CM

## 2021-06-26 ENCOUNTER — Other Ambulatory Visit: Payer: Self-pay | Admitting: Family Medicine

## 2021-06-29 NOTE — Progress Notes (Signed)
Established Patient Office Visit  Subjective:  Patient ID: Angela Myers, female    DOB: [birth year]  Age: 80 y.o. MRN: [id number]  CC:  Chief Complaint  Patient presents with   Medication Refill    HPI Angela Myers presents for medication refills.   HYPERLIPIDEMIA - medications: simvastatin 40 mg - compliance: 100% - medication SEs: none The 10-year ASCVD risk score (Arnett DK, et al., 2019) is: 28.3%   Values used to calculate the score:     Age: 58 years     Sex: Female     Is Non-Hispanic African American: No     Diabetic: No     Tobacco smoker: No     Systolic Blood Pressure: 497 mmHg     Is BP treated: No     HDL Cholesterol: 63 mg/dL     Total Cholesterol: 162 mg/dL  Hypothyroidism Follow-up: -Taking medications as prescribed in the morning, apart from other foods, meds, vitamins, etc.  -No recent changes to hair, skin, nails, energy levels  GERD Control status: well controlled Satisfied with current treatment? yes Medication side effects: no  Medication compliance:  100% Heartburn frequency: rare Denies dysphagia, odynophagia, hematemesis, blood in stool  ANXIETY Paxil 40 mg daily No side effects No SI/HI [employer] Visit from 06/30/2021 in [place]  PHQ-9 Total Score 0      GAD 7 : Generalized Anxiety Score 06/30/2021 07/13/2018 12/26/2017  Nervous, Anxious, on Edge 0 0 0  Control/stop worrying 0 0 0  Worry too much - different things 0 0 0  Trouble relaxing 0 0 0  Restless 0 0 0  Easily annoyed or irritable 0 0 0  Afraid - awful might happen 0 0 0  Total GAD 7 Score 0 0 0  Anxiety Difficulty Not difficult at all Not difficult at all -          Past Medical History:  Diagnosis Date   Thyroid disease     No past surgical history on file.  Family History  Problem Relation Age of Onset   Diabetes Maternal Aunt    Heart attack Maternal Aunt     Social History   Socioeconomic  History   Marital status: Married    Spouse name: John   Number of children: 3   Years of education: high school    Highest education level: 12th grade  Occupational History   Occupation: retired    Comment: Agricultural consultant  Tobacco Use   Smoking status: Former    Packs/day: 1.00    Years: 15.00    Pack years: 15.00    Types: Cigarettes   Smokeless tobacco: Never  Vaping Use   Vaping Use: Never used  Substance and Sexual Activity   Alcohol use: Yes    Alcohol/week: 1.0 standard drink    Types: 1 Glasses of wine per week    Comment: socially   Drug use: Never   Sexual activity: Yes  Other Topics Concern   Not on file  Social History Narrative   Lives with husband and dog. Gets out with family a lot. Walks for exercise   Social Determinants of Health   Financial Resource Strain: Not on file  Food Insecurity: Not on file  Transportation Needs: Not on file  Physical Activity: Not on file  Stress: Not on file  Social Connections: Not on file  Intimate Partner Violence: Not on file    Outpatient Medications Prior  to Visit  Medication Sig Dispense Refill   Cholecalciferol (VITAMIN D) 125 MCG (5000 UT) CAPS Take by mouth daily.     aspirin EC 81 MG tablet Take by mouth.     cyanocobalamin (V-R VITAMIN B-12) 500 MCG tablet Take by mouth.     Ipratropium-Albuterol (COMBIVENT) 20-100 MCG/ACT AERS respimat Inhale 1 puff into the lungs every 6 (six) hours as needed for wheezing or shortness of breath. 1 Inhaler 1   Nebulizer MISC Nebulizer machine and necessary tubing and other supplies per patient preference/insurance coverage. Dx: J18.1, R05 1 each prn   Pumpkin Seed-Soy Germ (AZO BLADDER CONTROL/GO-LESS) CAPS Take by mouth.     levothyroxine (SYNTHROID) 75 MCG tablet TAKE 1 TABLET BY MOUTH DAILY BEFORE BREAKFAST. LAST REFILL. NEEDS APPT W/NEW PCP. 90 tablet 0   omeprazole (PRILOSEC) 20 MG capsule Take 1 capsule (20 mg total) by mouth daily. Last refill. Last visit was 09/18/18. Needs  an appt. 30 capsule 0   PARoxetine (PAXIL) 40 MG tablet TAKE 1 TABLET BY MOUTH  DAILY 90 tablet 1   predniSONE (DELTASONE) 20 MG tablet Take 1 tablet (20 mg total) by mouth 2 (two) times daily with a meal. (Patient not taking: Reported on 04/23/2019) 10 tablet 0   simvastatin (ZOCOR) 40 MG tablet Take 1 tablet (40 mg total) by mouth at bedtime. NO REFILLS. LAST APPT WAS 09/28/18. NEEDS TO TRANSITION CARE TO NEW PCP. 30 tablet 0   No facility-administered medications prior to visit.    Allergies  Allergen Reactions   Fesoterodine Other (See Comments)    Abdominal pain 08/30/13 patient stated does not recall allergy     Gabapentin Other (See Comments)    "felt like she was going to die" 08/30/13 patient stated does not recall allergy     ROS Review of Systems All review of systems negative except what is listed in the HPI    Objective:    Physical Exam Vitals reviewed.  Constitutional:      Appearance: Normal appearance. She is normal weight.  HENT:     Head: Normocephalic and atraumatic.  Cardiovascular:     Rate and Rhythm: Normal rate and regular rhythm.     Pulses: Normal pulses.     Heart sounds: Normal heart sounds.  Pulmonary:     Effort: Pulmonary effort is normal.     Breath sounds: Normal breath sounds.  Musculoskeletal:     Cervical back: Normal range of motion and neck supple. No tenderness.  Lymphadenopathy:     Cervical: No cervical adenopathy.  Skin:    General: Skin is warm and dry.     Findings: No rash.  Neurological:     General: No focal deficit present.     Mental Status: She is alert.     Comments: A&Ox3, trouble recalling year  Psychiatric:        Mood and Affect: Mood normal.        Behavior: Behavior normal.        Thought Content: Thought content normal.        Judgment: Judgment normal.    BP (!) 138/54   Pulse 90   Resp 17   Wt 122 lb 8 oz (55.6 kg)   SpO2 96%   BMI 21.03 kg/m  Wt Readings from Last 3 Encounters:  06/30/21 122  lb 8 oz (55.6 kg)  04/23/19 126 lb (57.2 kg)  09/18/18 126 lb 12.8 oz (57.5 kg)     Health Maintenance Due  Topic Date Due   COVID-19 Vaccine (1) Never done   Hepatitis C Screening  Never done   TETANUS/TDAP  Never done   Zoster Vaccines- Shingrix (1 of 2) Never done   Pneumonia Vaccine 28+ Years old (3 - PPSV23 if available, else PCV20) 11/12/2016   INFLUENZA VACCINE  03/09/2021    There are no preventive care reminders to display for this patient.  Lab Results  Component Value Date   TSH 0.40 07/20/2019   Lab Results  Component Value Date   WBC 5.7 07/24/2018   HGB 12.4 07/24/2018   HCT 36.5 07/24/2018   MCV 90.1 07/24/2018   PLT 242 07/24/2018   Lab Results  Component Value Date   NA 144 07/24/2018   K 5.2 07/24/2018   CO2 29 07/24/2018   GLUCOSE 92 07/24/2018   BUN 16 07/24/2018   CREATININE 0.95 (H) 07/24/2018   BILITOT 0.5 07/24/2018   AST 16 07/24/2018   ALT 9 07/24/2018   PROT 6.3 07/24/2018   CALCIUM 9.7 07/24/2018   Lab Results  Component Value Date   CHOL 162 07/24/2018   Lab Results  Component Value Date   HDL 63 07/24/2018   Lab Results  Component Value Date   LDLCALC 85 07/24/2018   Lab Results  Component Value Date   TRIG 68 07/24/2018   Lab Results  Component Value Date   CHOLHDL 2.6 07/24/2018   No results found for: HGBA1C    Assessment & Plan:   Problem List Items Addressed This Visit       Digestive   GERD (gastroesophageal reflux disease)    Doing well on current regimen, no complaints.  Discussed that she may not need to be on omeprazole every day long-term; can try cutting back.  Avoid triggers. Refill placed.      Relevant Medications   omeprazole (PRILOSEC) 20 MG capsule     Endocrine   Hypothyroidism    Previously well controlled Continue Synthroid at current dose  Recheck TSH and adjust Synthroid as indicated       Relevant Medications   levothyroxine (SYNTHROID) 75 MCG tablet   Other Relevant Orders    CBC   TSH     Other   Anxiety disorder - Primary    Doing well on current dose of Paxil.  Negative PHQ-9 and GAD-7 today.  Refill placed.      Relevant Medications   PARoxetine (PAXIL) 40 MG tablet   HLD (hyperlipidemia)    HLD PLAN: -No recent lipid panel  -Medication management: continue current dose of simvastatin -Repeat CMP and lipid panel today  -Diet low in saturated fat -Regular exercise/physical activity- at least 30 minutes, 5 times per week       Relevant Medications   simvastatin (ZOCOR) 40 MG tablet   Other Relevant Orders   CBC   Comprehensive metabolic panel   Lipid panel      Follow-up: Return in about 6 months (around 12/28/2021) for physical/establish with new PCP in 3-6 months.    Terrilyn Saver, NP

## 2021-06-30 ENCOUNTER — Ambulatory Visit (INDEPENDENT_AMBULATORY_CARE_PROVIDER_SITE_OTHER): Payer: Medicare Other | Admitting: Family Medicine

## 2021-06-30 ENCOUNTER — Other Ambulatory Visit: Payer: Self-pay

## 2021-06-30 ENCOUNTER — Encounter: Payer: Self-pay | Admitting: Family Medicine

## 2021-06-30 VITALS — BP 138/54 | HR 90 | Resp 17 | Wt 122.5 lb

## 2021-06-30 DIAGNOSIS — E785 Hyperlipidemia, unspecified: Secondary | ICD-10-CM

## 2021-06-30 DIAGNOSIS — D649 Anemia, unspecified: Secondary | ICD-10-CM

## 2021-06-30 DIAGNOSIS — E039 Hypothyroidism, unspecified: Secondary | ICD-10-CM

## 2021-06-30 DIAGNOSIS — F419 Anxiety disorder, unspecified: Secondary | ICD-10-CM | POA: Diagnosis not present

## 2021-06-30 DIAGNOSIS — K219 Gastro-esophageal reflux disease without esophagitis: Secondary | ICD-10-CM | POA: Diagnosis not present

## 2021-06-30 DIAGNOSIS — R7989 Other specified abnormal findings of blood chemistry: Secondary | ICD-10-CM

## 2021-06-30 MED ORDER — PAROXETINE HCL 40 MG PO TABS: 40.0000 mg | ORAL_TABLET | Freq: Every day | ORAL | 1 refills | Status: DC

## 2021-06-30 MED ORDER — SIMVASTATIN 40 MG PO TABS: 40.0000 mg | ORAL_TABLET | Freq: Every day | ORAL | 1 refills | Status: DC

## 2021-06-30 MED ORDER — OMEPRAZOLE 20 MG PO CPDR
20.0000 mg | DELAYED_RELEASE_CAPSULE | Freq: Every day | ORAL | 1 refills | Status: DC
Start: 2021-06-30 — End: 2022-03-12

## 2021-06-30 MED ORDER — LEVOTHYROXINE SODIUM 75 MCG PO TABS: ORAL_TABLET | ORAL | 1 refills | Status: DC

## 2021-06-30 NOTE — Assessment & Plan Note (Signed)
Doing well on current regimen, no complaints.  Discussed that she may not need to be on omeprazole every day long-term; can try cutting back.  Avoid triggers. Refill placed.

## 2021-06-30 NOTE — Assessment & Plan Note (Signed)
Doing well on current dose of Paxil.  Negative PHQ-9 and GAD-7 today.  Refill placed.

## 2021-06-30 NOTE — Assessment & Plan Note (Signed)
Previously well controlled Continue Synthroid at current dose  Recheck TSH and adjust Synthroid as indicated   

## 2021-06-30 NOTE — Assessment & Plan Note (Signed)
HLD PLAN: -No recent lipid panel  -Medication management: continue current dose of simvastatin -Repeat CMP and lipid panel today  -Diet low in saturated fat -Regular exercise/physical activity- at least 30 minutes, 5 times per week

## 2021-06-30 NOTE — Patient Instructions (Signed)
Refills ordered today.  Lab work today - we will let you know results and any changes to plan.  Please schedule physical in 3-6 months to establish with new provider

## 2021-07-01 NOTE — Addendum Note (Signed)
Addended by: Caleen Jobs B on: 07/01/2021 09:05 AM   Modules accepted: Orders

## 2021-07-06 LAB — COMPREHENSIVE METABOLIC PANEL
AG Ratio: 2 (calc) (ref 1.0–2.5)
ALT: 8 U/L (ref 6–29)
AST: 14 U/L (ref 10–35)
Albumin: 4.3 g/dL (ref 3.6–5.1)
Alkaline phosphatase (APISO): 66 U/L (ref 37–153)
BUN/Creatinine Ratio: 14 (calc) (ref 6–22)
BUN: 15 mg/dL (ref 7–25)
CO2: 29 mmol/L (ref 20–32)
Calcium: 9.5 mg/dL (ref 8.6–10.4)
Chloride: 106 mmol/L (ref 98–110)
Creat: 1.11 mg/dL — ABNORMAL HIGH (ref 0.60–1.00)
Globulin: 2.2 g/dL (calc) (ref 1.9–3.7)
Glucose, Bld: 120 mg/dL — ABNORMAL HIGH (ref 65–99)
Potassium: 4.6 mmol/L (ref 3.5–5.3)
Sodium: 142 mmol/L (ref 135–146)
Total Bilirubin: 0.5 mg/dL (ref 0.2–1.2)
Total Protein: 6.5 g/dL (ref 6.1–8.1)

## 2021-07-06 LAB — TEST AUTHORIZATION 2

## 2021-07-06 LAB — CBC
HCT: 30.4 % — ABNORMAL LOW (ref 35.0–45.0)
Hemoglobin: 9.5 g/dL — ABNORMAL LOW (ref 11.7–15.5)
MCH: 26.5 pg — ABNORMAL LOW (ref 27.0–33.0)
MCHC: 31.3 g/dL — ABNORMAL LOW (ref 32.0–36.0)
MCV: 84.9 fL (ref 80.0–100.0)
MPV: 10.7 fL (ref 7.5–12.5)
Platelets: 226 10*3/uL (ref 140–400)
RBC: 3.58 10*6/uL — ABNORMAL LOW (ref 3.80–5.10)
RDW: 12.7 % (ref 11.0–15.0)
WBC: 8.2 10*3/uL (ref 3.8–10.8)

## 2021-07-06 LAB — LIPID PANEL
Cholesterol: 147 mg/dL (ref <200)
HDL: 66 mg/dL (ref >=50)
LDL Cholesterol (Calc): 67 mg/dL (calc)
Non-HDL Cholesterol (Calc): 81 mg/dL (calc) (ref <130)
Total CHOL/HDL Ratio: 2.2 (calc) (ref <5.0)
Triglycerides: 64 mg/dL (ref <150)

## 2021-07-06 LAB — IRON: Iron: 39 ug/dL — ABNORMAL LOW (ref 45–160)

## 2021-07-06 LAB — TSH: TSH: 1.44 mIU/L (ref 0.40–4.50)

## 2021-07-06 LAB — RETICULOCYTES

## 2021-07-06 NOTE — Addendum Note (Signed)
Addended by: Caleen Jobs B on: 07/06/2021 11:35 AM   Modules accepted: Orders

## 2021-10-01 ENCOUNTER — Ambulatory Visit: Payer: Medicare Other | Admitting: Medical-Surgical

## 2021-11-18 ENCOUNTER — Other Ambulatory Visit: Payer: Self-pay | Admitting: Family Medicine

## 2021-11-18 DIAGNOSIS — F419 Anxiety disorder, unspecified: Secondary | ICD-10-CM

## 2021-12-02 DIAGNOSIS — Z96641 Presence of right artificial hip joint: Secondary | ICD-10-CM | POA: Diagnosis not present

## 2021-12-02 DIAGNOSIS — I10 Essential (primary) hypertension: Secondary | ICD-10-CM | POA: Diagnosis not present

## 2021-12-02 DIAGNOSIS — I4891 Unspecified atrial fibrillation: Secondary | ICD-10-CM | POA: Diagnosis not present

## 2021-12-02 DIAGNOSIS — I499 Cardiac arrhythmia, unspecified: Secondary | ICD-10-CM | POA: Diagnosis not present

## 2021-12-02 DIAGNOSIS — Z853 Personal history of malignant neoplasm of breast: Secondary | ICD-10-CM | POA: Diagnosis not present

## 2021-12-02 DIAGNOSIS — I471 Supraventricular tachycardia: Secondary | ICD-10-CM | POA: Diagnosis not present

## 2021-12-02 DIAGNOSIS — R7989 Other specified abnormal findings of blood chemistry: Secondary | ICD-10-CM | POA: Diagnosis not present

## 2021-12-02 DIAGNOSIS — K219 Gastro-esophageal reflux disease without esophagitis: Secondary | ICD-10-CM | POA: Diagnosis not present

## 2021-12-02 DIAGNOSIS — R296 Repeated falls: Secondary | ICD-10-CM | POA: Diagnosis not present

## 2021-12-02 DIAGNOSIS — D638 Anemia in other chronic diseases classified elsewhere: Secondary | ICD-10-CM | POA: Diagnosis not present

## 2021-12-02 DIAGNOSIS — R791 Abnormal coagulation profile: Secondary | ICD-10-CM | POA: Diagnosis not present

## 2021-12-02 DIAGNOSIS — Z7982 Long term (current) use of aspirin: Secondary | ICD-10-CM | POA: Diagnosis not present

## 2021-12-02 DIAGNOSIS — Z8673 Personal history of transient ischemic attack (TIA), and cerebral infarction without residual deficits: Secondary | ICD-10-CM | POA: Diagnosis not present

## 2021-12-02 DIAGNOSIS — Z888 Allergy status to other drugs, medicaments and biological substances status: Secondary | ICD-10-CM | POA: Diagnosis not present

## 2021-12-02 DIAGNOSIS — S72001A Fracture of unspecified part of neck of right femur, initial encounter for closed fracture: Secondary | ICD-10-CM | POA: Diagnosis not present

## 2021-12-02 DIAGNOSIS — I480 Paroxysmal atrial fibrillation: Secondary | ICD-10-CM | POA: Diagnosis not present

## 2021-12-02 DIAGNOSIS — E039 Hypothyroidism, unspecified: Secondary | ICD-10-CM | POA: Diagnosis not present

## 2021-12-02 DIAGNOSIS — R0902 Hypoxemia: Secondary | ICD-10-CM | POA: Diagnosis not present

## 2021-12-02 DIAGNOSIS — I97191 Other postprocedural cardiac functional disturbances following other surgery: Secondary | ICD-10-CM | POA: Diagnosis not present

## 2021-12-02 DIAGNOSIS — R404 Transient alteration of awareness: Secondary | ICD-10-CM | POA: Diagnosis not present

## 2021-12-02 DIAGNOSIS — R778 Other specified abnormalities of plasma proteins: Secondary | ICD-10-CM | POA: Diagnosis not present

## 2021-12-02 DIAGNOSIS — Z743 Need for continuous supervision: Secondary | ICD-10-CM | POA: Diagnosis not present

## 2021-12-02 DIAGNOSIS — Z043 Encounter for examination and observation following other accident: Secondary | ICD-10-CM | POA: Diagnosis not present

## 2021-12-02 DIAGNOSIS — I083 Combined rheumatic disorders of mitral, aortic and tricuspid valves: Secondary | ICD-10-CM | POA: Diagnosis not present

## 2021-12-02 DIAGNOSIS — S72041D Displaced fracture of base of neck of right femur, subsequent encounter for closed fracture with routine healing: Secondary | ICD-10-CM | POA: Diagnosis not present

## 2021-12-02 DIAGNOSIS — R6889 Other general symptoms and signs: Secondary | ICD-10-CM | POA: Diagnosis not present

## 2021-12-02 DIAGNOSIS — E785 Hyperlipidemia, unspecified: Secondary | ICD-10-CM | POA: Diagnosis not present

## 2021-12-02 DIAGNOSIS — J9601 Acute respiratory failure with hypoxia: Secondary | ICD-10-CM | POA: Diagnosis not present

## 2021-12-02 DIAGNOSIS — I081 Rheumatic disorders of both mitral and tricuspid valves: Secondary | ICD-10-CM | POA: Diagnosis not present

## 2021-12-02 DIAGNOSIS — Z4789 Encounter for other orthopedic aftercare: Secondary | ICD-10-CM | POA: Diagnosis not present

## 2021-12-02 DIAGNOSIS — M25572 Pain in left ankle and joints of left foot: Secondary | ICD-10-CM | POA: Diagnosis not present

## 2021-12-02 DIAGNOSIS — M25551 Pain in right hip: Secondary | ICD-10-CM | POA: Diagnosis not present

## 2021-12-02 DIAGNOSIS — Z79899 Other long term (current) drug therapy: Secondary | ICD-10-CM | POA: Diagnosis not present

## 2021-12-02 DIAGNOSIS — J432 Centrilobular emphysema: Secondary | ICD-10-CM | POA: Diagnosis not present

## 2021-12-02 DIAGNOSIS — R410 Disorientation, unspecified: Secondary | ICD-10-CM | POA: Diagnosis not present

## 2021-12-02 DIAGNOSIS — F32A Depression, unspecified: Secondary | ICD-10-CM | POA: Diagnosis not present

## 2021-12-02 DIAGNOSIS — Z9010 Acquired absence of unspecified breast and nipple: Secondary | ICD-10-CM | POA: Diagnosis not present

## 2021-12-02 DIAGNOSIS — E7849 Other hyperlipidemia: Secondary | ICD-10-CM | POA: Diagnosis not present

## 2021-12-02 DIAGNOSIS — S72091A Other fracture of head and neck of right femur, initial encounter for closed fracture: Secondary | ICD-10-CM | POA: Diagnosis not present

## 2021-12-02 DIAGNOSIS — J9809 Other diseases of bronchus, not elsewhere classified: Secondary | ICD-10-CM | POA: Diagnosis not present

## 2021-12-02 DIAGNOSIS — S79912A Unspecified injury of left hip, initial encounter: Secondary | ICD-10-CM | POA: Diagnosis not present

## 2021-12-03 DIAGNOSIS — E785 Hyperlipidemia, unspecified: Secondary | ICD-10-CM | POA: Diagnosis not present

## 2021-12-03 DIAGNOSIS — Z8673 Personal history of transient ischemic attack (TIA), and cerebral infarction without residual deficits: Secondary | ICD-10-CM | POA: Diagnosis not present

## 2021-12-03 DIAGNOSIS — E039 Hypothyroidism, unspecified: Secondary | ICD-10-CM | POA: Diagnosis not present

## 2021-12-03 DIAGNOSIS — I10 Essential (primary) hypertension: Secondary | ICD-10-CM | POA: Diagnosis not present

## 2021-12-03 DIAGNOSIS — K219 Gastro-esophageal reflux disease without esophagitis: Secondary | ICD-10-CM | POA: Diagnosis not present

## 2021-12-03 DIAGNOSIS — F32A Depression, unspecified: Secondary | ICD-10-CM | POA: Diagnosis not present

## 2021-12-04 DIAGNOSIS — Z8673 Personal history of transient ischemic attack (TIA), and cerebral infarction without residual deficits: Secondary | ICD-10-CM | POA: Diagnosis not present

## 2021-12-04 DIAGNOSIS — I10 Essential (primary) hypertension: Secondary | ICD-10-CM | POA: Diagnosis not present

## 2021-12-04 DIAGNOSIS — E785 Hyperlipidemia, unspecified: Secondary | ICD-10-CM | POA: Diagnosis not present

## 2021-12-04 DIAGNOSIS — K219 Gastro-esophageal reflux disease without esophagitis: Secondary | ICD-10-CM | POA: Diagnosis not present

## 2021-12-05 DIAGNOSIS — E785 Hyperlipidemia, unspecified: Secondary | ICD-10-CM | POA: Diagnosis not present

## 2021-12-05 DIAGNOSIS — K219 Gastro-esophageal reflux disease without esophagitis: Secondary | ICD-10-CM | POA: Diagnosis not present

## 2021-12-05 DIAGNOSIS — Z8673 Personal history of transient ischemic attack (TIA), and cerebral infarction without residual deficits: Secondary | ICD-10-CM | POA: Diagnosis not present

## 2021-12-05 DIAGNOSIS — I10 Essential (primary) hypertension: Secondary | ICD-10-CM | POA: Diagnosis not present

## 2021-12-06 DIAGNOSIS — I480 Paroxysmal atrial fibrillation: Secondary | ICD-10-CM | POA: Insufficient documentation

## 2021-12-06 DIAGNOSIS — I499 Cardiac arrhythmia, unspecified: Secondary | ICD-10-CM | POA: Insufficient documentation

## 2021-12-08 DIAGNOSIS — R5381 Other malaise: Secondary | ICD-10-CM | POA: Diagnosis not present

## 2021-12-08 DIAGNOSIS — Z8673 Personal history of transient ischemic attack (TIA), and cerebral infarction without residual deficits: Secondary | ICD-10-CM | POA: Diagnosis not present

## 2021-12-08 DIAGNOSIS — R0902 Hypoxemia: Secondary | ICD-10-CM | POA: Diagnosis not present

## 2021-12-08 DIAGNOSIS — I10 Essential (primary) hypertension: Secondary | ICD-10-CM | POA: Diagnosis not present

## 2021-12-08 DIAGNOSIS — I480 Paroxysmal atrial fibrillation: Secondary | ICD-10-CM | POA: Diagnosis not present

## 2021-12-08 DIAGNOSIS — S72001A Fracture of unspecified part of neck of right femur, initial encounter for closed fracture: Secondary | ICD-10-CM | POA: Diagnosis not present

## 2021-12-08 DIAGNOSIS — F32A Depression, unspecified: Secondary | ICD-10-CM | POA: Diagnosis not present

## 2021-12-08 DIAGNOSIS — N390 Urinary tract infection, site not specified: Secondary | ICD-10-CM | POA: Diagnosis not present

## 2021-12-08 DIAGNOSIS — M25551 Pain in right hip: Secondary | ICD-10-CM | POA: Diagnosis not present

## 2021-12-08 DIAGNOSIS — F0390 Unspecified dementia without behavioral disturbance: Secondary | ICD-10-CM | POA: Insufficient documentation

## 2021-12-08 DIAGNOSIS — E7849 Other hyperlipidemia: Secondary | ICD-10-CM | POA: Diagnosis not present

## 2021-12-08 DIAGNOSIS — R404 Transient alteration of awareness: Secondary | ICD-10-CM | POA: Diagnosis not present

## 2021-12-08 DIAGNOSIS — R2231 Localized swelling, mass and lump, right upper limb: Secondary | ICD-10-CM | POA: Diagnosis not present

## 2021-12-08 DIAGNOSIS — J9601 Acute respiratory failure with hypoxia: Secondary | ICD-10-CM | POA: Diagnosis not present

## 2021-12-08 DIAGNOSIS — Z743 Need for continuous supervision: Secondary | ICD-10-CM | POA: Diagnosis not present

## 2021-12-08 DIAGNOSIS — I471 Supraventricular tachycardia: Secondary | ICD-10-CM | POA: Diagnosis not present

## 2021-12-08 DIAGNOSIS — R52 Pain, unspecified: Secondary | ICD-10-CM | POA: Diagnosis not present

## 2021-12-08 DIAGNOSIS — Z4789 Encounter for other orthopedic aftercare: Secondary | ICD-10-CM | POA: Diagnosis not present

## 2021-12-08 DIAGNOSIS — S72001D Fracture of unspecified part of neck of right femur, subsequent encounter for closed fracture with routine healing: Secondary | ICD-10-CM | POA: Diagnosis not present

## 2021-12-08 DIAGNOSIS — D649 Anemia, unspecified: Secondary | ICD-10-CM | POA: Diagnosis not present

## 2021-12-08 DIAGNOSIS — R350 Frequency of micturition: Secondary | ICD-10-CM | POA: Diagnosis not present

## 2021-12-08 DIAGNOSIS — G4734 Idiopathic sleep related nonobstructive alveolar hypoventilation: Secondary | ICD-10-CM | POA: Diagnosis not present

## 2021-12-08 DIAGNOSIS — W19XXXA Unspecified fall, initial encounter: Secondary | ICD-10-CM | POA: Diagnosis not present

## 2021-12-08 DIAGNOSIS — N183 Chronic kidney disease, stage 3 unspecified: Secondary | ICD-10-CM | POA: Diagnosis not present

## 2021-12-08 DIAGNOSIS — S72041D Displaced fracture of base of neck of right femur, subsequent encounter for closed fracture with routine healing: Secondary | ICD-10-CM | POA: Diagnosis not present

## 2021-12-08 DIAGNOSIS — I4891 Unspecified atrial fibrillation: Secondary | ICD-10-CM | POA: Diagnosis not present

## 2021-12-08 DIAGNOSIS — S6991XA Unspecified injury of right wrist, hand and finger(s), initial encounter: Secondary | ICD-10-CM | POA: Diagnosis not present

## 2021-12-08 DIAGNOSIS — M25531 Pain in right wrist: Secondary | ICD-10-CM | POA: Diagnosis not present

## 2021-12-08 DIAGNOSIS — I499 Cardiac arrhythmia, unspecified: Secondary | ICD-10-CM | POA: Diagnosis not present

## 2021-12-08 DIAGNOSIS — K219 Gastro-esophageal reflux disease without esophagitis: Secondary | ICD-10-CM | POA: Diagnosis not present

## 2021-12-08 DIAGNOSIS — E039 Hypothyroidism, unspecified: Secondary | ICD-10-CM | POA: Diagnosis not present

## 2021-12-08 DIAGNOSIS — Z96641 Presence of right artificial hip joint: Secondary | ICD-10-CM | POA: Diagnosis not present

## 2021-12-08 DIAGNOSIS — E785 Hyperlipidemia, unspecified: Secondary | ICD-10-CM | POA: Diagnosis not present

## 2021-12-08 DIAGNOSIS — M79641 Pain in right hand: Secondary | ICD-10-CM | POA: Diagnosis not present

## 2021-12-09 DIAGNOSIS — I10 Essential (primary) hypertension: Secondary | ICD-10-CM | POA: Diagnosis not present

## 2021-12-09 DIAGNOSIS — E039 Hypothyroidism, unspecified: Secondary | ICD-10-CM | POA: Diagnosis not present

## 2021-12-09 DIAGNOSIS — M25551 Pain in right hip: Secondary | ICD-10-CM | POA: Diagnosis not present

## 2021-12-09 DIAGNOSIS — S72001D Fracture of unspecified part of neck of right femur, subsequent encounter for closed fracture with routine healing: Secondary | ICD-10-CM | POA: Diagnosis not present

## 2021-12-09 DIAGNOSIS — G4734 Idiopathic sleep related nonobstructive alveolar hypoventilation: Secondary | ICD-10-CM | POA: Diagnosis not present

## 2021-12-09 DIAGNOSIS — E785 Hyperlipidemia, unspecified: Secondary | ICD-10-CM | POA: Diagnosis not present

## 2021-12-09 DIAGNOSIS — K219 Gastro-esophageal reflux disease without esophagitis: Secondary | ICD-10-CM | POA: Diagnosis not present

## 2021-12-09 DIAGNOSIS — I480 Paroxysmal atrial fibrillation: Secondary | ICD-10-CM | POA: Diagnosis not present

## 2021-12-09 DIAGNOSIS — R350 Frequency of micturition: Secondary | ICD-10-CM | POA: Diagnosis not present

## 2021-12-09 DIAGNOSIS — Z8673 Personal history of transient ischemic attack (TIA), and cerebral infarction without residual deficits: Secondary | ICD-10-CM | POA: Diagnosis not present

## 2021-12-09 DIAGNOSIS — R5381 Other malaise: Secondary | ICD-10-CM | POA: Diagnosis not present

## 2021-12-09 DIAGNOSIS — F32A Depression, unspecified: Secondary | ICD-10-CM | POA: Diagnosis not present

## 2021-12-11 DIAGNOSIS — I480 Paroxysmal atrial fibrillation: Secondary | ICD-10-CM | POA: Diagnosis not present

## 2021-12-11 DIAGNOSIS — E785 Hyperlipidemia, unspecified: Secondary | ICD-10-CM | POA: Diagnosis not present

## 2021-12-11 DIAGNOSIS — I10 Essential (primary) hypertension: Secondary | ICD-10-CM | POA: Diagnosis not present

## 2021-12-11 DIAGNOSIS — Z8673 Personal history of transient ischemic attack (TIA), and cerebral infarction without residual deficits: Secondary | ICD-10-CM | POA: Diagnosis not present

## 2021-12-11 DIAGNOSIS — S72001D Fracture of unspecified part of neck of right femur, subsequent encounter for closed fracture with routine healing: Secondary | ICD-10-CM | POA: Diagnosis not present

## 2021-12-11 DIAGNOSIS — R5381 Other malaise: Secondary | ICD-10-CM | POA: Diagnosis not present

## 2021-12-11 DIAGNOSIS — K219 Gastro-esophageal reflux disease without esophagitis: Secondary | ICD-10-CM | POA: Diagnosis not present

## 2021-12-11 DIAGNOSIS — E039 Hypothyroidism, unspecified: Secondary | ICD-10-CM | POA: Diagnosis not present

## 2021-12-11 DIAGNOSIS — G4734 Idiopathic sleep related nonobstructive alveolar hypoventilation: Secondary | ICD-10-CM | POA: Diagnosis not present

## 2021-12-14 ENCOUNTER — Other Ambulatory Visit: Payer: Self-pay | Admitting: Family Medicine

## 2021-12-14 DIAGNOSIS — K219 Gastro-esophageal reflux disease without esophagitis: Secondary | ICD-10-CM

## 2021-12-14 DIAGNOSIS — E785 Hyperlipidemia, unspecified: Secondary | ICD-10-CM

## 2021-12-16 DIAGNOSIS — R5381 Other malaise: Secondary | ICD-10-CM | POA: Diagnosis not present

## 2021-12-16 DIAGNOSIS — M25551 Pain in right hip: Secondary | ICD-10-CM | POA: Diagnosis not present

## 2021-12-16 DIAGNOSIS — I480 Paroxysmal atrial fibrillation: Secondary | ICD-10-CM | POA: Diagnosis not present

## 2021-12-16 DIAGNOSIS — R350 Frequency of micturition: Secondary | ICD-10-CM | POA: Diagnosis not present

## 2021-12-16 DIAGNOSIS — S72001D Fracture of unspecified part of neck of right femur, subsequent encounter for closed fracture with routine healing: Secondary | ICD-10-CM | POA: Diagnosis not present

## 2021-12-16 DIAGNOSIS — K219 Gastro-esophageal reflux disease without esophagitis: Secondary | ICD-10-CM | POA: Diagnosis not present

## 2021-12-16 DIAGNOSIS — E785 Hyperlipidemia, unspecified: Secondary | ICD-10-CM | POA: Diagnosis not present

## 2021-12-16 DIAGNOSIS — F32A Depression, unspecified: Secondary | ICD-10-CM | POA: Diagnosis not present

## 2021-12-16 DIAGNOSIS — I10 Essential (primary) hypertension: Secondary | ICD-10-CM | POA: Diagnosis not present

## 2021-12-16 DIAGNOSIS — E039 Hypothyroidism, unspecified: Secondary | ICD-10-CM | POA: Diagnosis not present

## 2021-12-22 DIAGNOSIS — N183 Chronic kidney disease, stage 3 unspecified: Secondary | ICD-10-CM | POA: Diagnosis not present

## 2021-12-22 DIAGNOSIS — S6991XA Unspecified injury of right wrist, hand and finger(s), initial encounter: Secondary | ICD-10-CM | POA: Diagnosis not present

## 2021-12-22 DIAGNOSIS — D649 Anemia, unspecified: Secondary | ICD-10-CM | POA: Diagnosis not present

## 2021-12-23 DIAGNOSIS — E039 Hypothyroidism, unspecified: Secondary | ICD-10-CM | POA: Diagnosis not present

## 2021-12-23 DIAGNOSIS — E785 Hyperlipidemia, unspecified: Secondary | ICD-10-CM | POA: Diagnosis not present

## 2021-12-23 DIAGNOSIS — F32A Depression, unspecified: Secondary | ICD-10-CM | POA: Diagnosis not present

## 2021-12-23 DIAGNOSIS — R5381 Other malaise: Secondary | ICD-10-CM | POA: Diagnosis not present

## 2021-12-23 DIAGNOSIS — R52 Pain, unspecified: Secondary | ICD-10-CM | POA: Diagnosis not present

## 2021-12-23 DIAGNOSIS — S72001D Fracture of unspecified part of neck of right femur, subsequent encounter for closed fracture with routine healing: Secondary | ICD-10-CM | POA: Diagnosis not present

## 2021-12-23 DIAGNOSIS — Z8673 Personal history of transient ischemic attack (TIA), and cerebral infarction without residual deficits: Secondary | ICD-10-CM | POA: Diagnosis not present

## 2021-12-23 DIAGNOSIS — K219 Gastro-esophageal reflux disease without esophagitis: Secondary | ICD-10-CM | POA: Diagnosis not present

## 2021-12-23 DIAGNOSIS — I10 Essential (primary) hypertension: Secondary | ICD-10-CM | POA: Diagnosis not present

## 2021-12-23 DIAGNOSIS — I480 Paroxysmal atrial fibrillation: Secondary | ICD-10-CM | POA: Diagnosis not present

## 2021-12-23 DIAGNOSIS — G4734 Idiopathic sleep related nonobstructive alveolar hypoventilation: Secondary | ICD-10-CM | POA: Diagnosis not present

## 2021-12-23 DIAGNOSIS — M25551 Pain in right hip: Secondary | ICD-10-CM | POA: Diagnosis not present

## 2021-12-23 DIAGNOSIS — R350 Frequency of micturition: Secondary | ICD-10-CM | POA: Diagnosis not present

## 2021-12-29 DIAGNOSIS — S72001D Fracture of unspecified part of neck of right femur, subsequent encounter for closed fracture with routine healing: Secondary | ICD-10-CM | POA: Diagnosis not present

## 2021-12-29 DIAGNOSIS — I480 Paroxysmal atrial fibrillation: Secondary | ICD-10-CM | POA: Diagnosis not present

## 2021-12-29 DIAGNOSIS — I10 Essential (primary) hypertension: Secondary | ICD-10-CM | POA: Diagnosis not present

## 2021-12-29 DIAGNOSIS — Z853 Personal history of malignant neoplasm of breast: Secondary | ICD-10-CM | POA: Diagnosis not present

## 2021-12-29 DIAGNOSIS — Z7901 Long term (current) use of anticoagulants: Secondary | ICD-10-CM | POA: Diagnosis not present

## 2021-12-29 DIAGNOSIS — F32A Depression, unspecified: Secondary | ICD-10-CM | POA: Diagnosis not present

## 2021-12-29 DIAGNOSIS — E039 Hypothyroidism, unspecified: Secondary | ICD-10-CM | POA: Diagnosis not present

## 2021-12-29 DIAGNOSIS — I69311 Memory deficit following cerebral infarction: Secondary | ICD-10-CM | POA: Diagnosis not present

## 2021-12-29 DIAGNOSIS — Z96641 Presence of right artificial hip joint: Secondary | ICD-10-CM | POA: Diagnosis not present

## 2021-12-30 DIAGNOSIS — Z9010 Acquired absence of unspecified breast and nipple: Secondary | ICD-10-CM | POA: Diagnosis not present

## 2021-12-30 DIAGNOSIS — R0602 Shortness of breath: Secondary | ICD-10-CM | POA: Diagnosis not present

## 2021-12-30 DIAGNOSIS — R9431 Abnormal electrocardiogram [ECG] [EKG]: Secondary | ICD-10-CM | POA: Diagnosis not present

## 2021-12-30 DIAGNOSIS — Z888 Allergy status to other drugs, medicaments and biological substances status: Secondary | ICD-10-CM | POA: Diagnosis not present

## 2021-12-30 DIAGNOSIS — N3000 Acute cystitis without hematuria: Secondary | ICD-10-CM | POA: Diagnosis not present

## 2021-12-30 DIAGNOSIS — F0394 Unspecified dementia, unspecified severity, with anxiety: Secondary | ICD-10-CM | POA: Diagnosis not present

## 2021-12-30 DIAGNOSIS — N390 Urinary tract infection, site not specified: Secondary | ICD-10-CM | POA: Diagnosis not present

## 2021-12-30 DIAGNOSIS — R930 Abnormal findings on diagnostic imaging of skull and head, not elsewhere classified: Secondary | ICD-10-CM | POA: Diagnosis not present

## 2021-12-30 DIAGNOSIS — K219 Gastro-esophageal reflux disease without esophagitis: Secondary | ICD-10-CM | POA: Diagnosis not present

## 2021-12-30 DIAGNOSIS — R0902 Hypoxemia: Secondary | ICD-10-CM | POA: Diagnosis not present

## 2021-12-30 DIAGNOSIS — K922 Gastrointestinal hemorrhage, unspecified: Secondary | ICD-10-CM | POA: Insufficient documentation

## 2021-12-30 DIAGNOSIS — Z7901 Long term (current) use of anticoagulants: Secondary | ICD-10-CM | POA: Diagnosis not present

## 2021-12-30 DIAGNOSIS — K862 Cyst of pancreas: Secondary | ICD-10-CM | POA: Diagnosis not present

## 2021-12-30 DIAGNOSIS — D649 Anemia, unspecified: Secondary | ICD-10-CM | POA: Diagnosis not present

## 2021-12-30 DIAGNOSIS — E7849 Other hyperlipidemia: Secondary | ICD-10-CM | POA: Diagnosis not present

## 2021-12-30 DIAGNOSIS — F0393 Unspecified dementia, unspecified severity, with mood disturbance: Secondary | ICD-10-CM | POA: Diagnosis not present

## 2021-12-30 DIAGNOSIS — Z743 Need for continuous supervision: Secondary | ICD-10-CM | POA: Diagnosis not present

## 2021-12-30 DIAGNOSIS — I110 Hypertensive heart disease with heart failure: Secondary | ICD-10-CM | POA: Diagnosis not present

## 2021-12-30 DIAGNOSIS — E039 Hypothyroidism, unspecified: Secondary | ICD-10-CM | POA: Diagnosis not present

## 2021-12-30 DIAGNOSIS — J9601 Acute respiratory failure with hypoxia: Secondary | ICD-10-CM | POA: Diagnosis not present

## 2021-12-30 DIAGNOSIS — R195 Other fecal abnormalities: Secondary | ICD-10-CM | POA: Diagnosis not present

## 2021-12-30 DIAGNOSIS — G929 Unspecified toxic encephalopathy: Secondary | ICD-10-CM | POA: Diagnosis not present

## 2021-12-30 DIAGNOSIS — R404 Transient alteration of awareness: Secondary | ICD-10-CM | POA: Diagnosis not present

## 2021-12-30 DIAGNOSIS — R1310 Dysphagia, unspecified: Secondary | ICD-10-CM | POA: Diagnosis not present

## 2021-12-30 DIAGNOSIS — R059 Cough, unspecified: Secondary | ICD-10-CM | POA: Diagnosis not present

## 2021-12-30 DIAGNOSIS — I480 Paroxysmal atrial fibrillation: Secondary | ICD-10-CM | POA: Diagnosis not present

## 2021-12-30 DIAGNOSIS — D3502 Benign neoplasm of left adrenal gland: Secondary | ICD-10-CM | POA: Diagnosis not present

## 2021-12-30 DIAGNOSIS — R6889 Other general symptoms and signs: Secondary | ICD-10-CM | POA: Diagnosis not present

## 2021-12-30 DIAGNOSIS — Z87891 Personal history of nicotine dependence: Secondary | ICD-10-CM | POA: Diagnosis not present

## 2021-12-30 DIAGNOSIS — K7689 Other specified diseases of liver: Secondary | ICD-10-CM | POA: Diagnosis not present

## 2021-12-30 DIAGNOSIS — E785 Hyperlipidemia, unspecified: Secondary | ICD-10-CM | POA: Diagnosis not present

## 2021-12-30 DIAGNOSIS — K5900 Constipation, unspecified: Secondary | ICD-10-CM | POA: Diagnosis not present

## 2021-12-30 DIAGNOSIS — D509 Iron deficiency anemia, unspecified: Secondary | ICD-10-CM | POA: Diagnosis not present

## 2021-12-30 DIAGNOSIS — G319 Degenerative disease of nervous system, unspecified: Secondary | ICD-10-CM | POA: Diagnosis not present

## 2021-12-30 DIAGNOSIS — M25552 Pain in left hip: Secondary | ICD-10-CM | POA: Diagnosis not present

## 2021-12-30 DIAGNOSIS — Z96641 Presence of right artificial hip joint: Secondary | ICD-10-CM | POA: Diagnosis not present

## 2021-12-30 DIAGNOSIS — D62 Acute posthemorrhagic anemia: Secondary | ICD-10-CM | POA: Diagnosis not present

## 2021-12-30 DIAGNOSIS — T17320D Food in larynx causing asphyxiation, subsequent encounter: Secondary | ICD-10-CM | POA: Diagnosis not present

## 2021-12-30 DIAGNOSIS — R9082 White matter disease, unspecified: Secondary | ICD-10-CM | POA: Diagnosis not present

## 2021-12-30 DIAGNOSIS — I10 Essential (primary) hypertension: Secondary | ICD-10-CM | POA: Diagnosis not present

## 2021-12-30 DIAGNOSIS — I5020 Unspecified systolic (congestive) heart failure: Secondary | ICD-10-CM | POA: Diagnosis not present

## 2021-12-30 DIAGNOSIS — F32A Depression, unspecified: Secondary | ICD-10-CM | POA: Diagnosis not present

## 2021-12-30 DIAGNOSIS — Z8673 Personal history of transient ischemic attack (TIA), and cerebral infarction without residual deficits: Secondary | ICD-10-CM | POA: Diagnosis not present

## 2021-12-30 DIAGNOSIS — R918 Other nonspecific abnormal finding of lung field: Secondary | ICD-10-CM | POA: Diagnosis not present

## 2021-12-30 DIAGNOSIS — M25551 Pain in right hip: Secondary | ICD-10-CM | POA: Diagnosis not present

## 2021-12-30 DIAGNOSIS — E876 Hypokalemia: Secondary | ICD-10-CM | POA: Diagnosis not present

## 2022-01-07 DIAGNOSIS — Z853 Personal history of malignant neoplasm of breast: Secondary | ICD-10-CM | POA: Diagnosis not present

## 2022-01-07 DIAGNOSIS — Z96641 Presence of right artificial hip joint: Secondary | ICD-10-CM | POA: Diagnosis not present

## 2022-01-07 DIAGNOSIS — I480 Paroxysmal atrial fibrillation: Secondary | ICD-10-CM | POA: Diagnosis not present

## 2022-01-07 DIAGNOSIS — Z7901 Long term (current) use of anticoagulants: Secondary | ICD-10-CM | POA: Diagnosis not present

## 2022-01-07 DIAGNOSIS — I69311 Memory deficit following cerebral infarction: Secondary | ICD-10-CM | POA: Diagnosis not present

## 2022-01-07 DIAGNOSIS — E039 Hypothyroidism, unspecified: Secondary | ICD-10-CM | POA: Diagnosis not present

## 2022-01-07 DIAGNOSIS — S72001D Fracture of unspecified part of neck of right femur, subsequent encounter for closed fracture with routine healing: Secondary | ICD-10-CM | POA: Diagnosis not present

## 2022-01-07 DIAGNOSIS — F32A Depression, unspecified: Secondary | ICD-10-CM | POA: Diagnosis not present

## 2022-01-07 DIAGNOSIS — I10 Essential (primary) hypertension: Secondary | ICD-10-CM | POA: Diagnosis not present

## 2022-01-11 NOTE — Progress Notes (Unsigned)
   Established Patient Office Visit  Subjective   Patient ID: Angela Myers, female   DOB: [birth year] Age: 81 y.o. MRN: [id number]   No chief complaint on file.   HPI Pleasant 81 year old female accompanied by her husband presenting today for a hospital discharge follow-up.  She was recently in [facility] rehabilitation after having a right hip hemiarthroplasty completed.  She was discharged home after approximately 3 weeks.  She has had several falls at home since her discharge.  On 5/24, she was admitted to the hospital with worsening altered mental status, recurrent vomiting, and a GI bleed.  During her hospitalization course, her hemoglobin remained stable and there was no aggressive GI bleeding so her chronic Eliquis was discontinued and she was placed on a PPI twice daily.  ROS    Objective:    There were no vitals filed for this visit.   Physical Exam    No results found for this or any previous visit (from the past 24 hour(s)).   {Labs (Optional):23779}  The 10-year ASCVD risk score (Arnett DK, et al., 2019) is: 47.7%   Values used to calculate the score:     Age: 85 years     Sex: Female     Is Non-Hispanic African American: No     Diabetic: No     Tobacco smoker: No     Systolic Blood Pressure: 809 mmHg     Is BP treated: Yes     HDL Cholesterol: 66 mg/dL     Total Cholesterol: 147 mg/dL   Assessment & Plan:   No problem-specific Assessment & Plan notes found for this encounter.   No follow-ups on file.  ___________________________________________ Clearnce Sorrel, DNP, APRN, FNP-BC [facility] [place]

## 2022-01-12 ENCOUNTER — Ambulatory Visit (INDEPENDENT_AMBULATORY_CARE_PROVIDER_SITE_OTHER): Payer: Medicare Other | Admitting: Medical-Surgical

## 2022-01-12 ENCOUNTER — Encounter: Payer: Self-pay | Admitting: Medical-Surgical

## 2022-01-12 VITALS — BP 135/63 | HR 85 | Resp 20 | Ht 64.0 in | Wt 113.1 lb

## 2022-01-12 DIAGNOSIS — Z7901 Long term (current) use of anticoagulants: Secondary | ICD-10-CM | POA: Diagnosis not present

## 2022-01-12 DIAGNOSIS — Z09 Encounter for follow-up examination after completed treatment for conditions other than malignant neoplasm: Secondary | ICD-10-CM

## 2022-01-12 DIAGNOSIS — Z853 Personal history of malignant neoplasm of breast: Secondary | ICD-10-CM | POA: Diagnosis not present

## 2022-01-12 DIAGNOSIS — I10 Essential (primary) hypertension: Secondary | ICD-10-CM | POA: Diagnosis not present

## 2022-01-12 DIAGNOSIS — Z96641 Presence of right artificial hip joint: Secondary | ICD-10-CM | POA: Diagnosis not present

## 2022-01-12 DIAGNOSIS — I69311 Memory deficit following cerebral infarction: Secondary | ICD-10-CM | POA: Diagnosis not present

## 2022-01-12 DIAGNOSIS — F419 Anxiety disorder, unspecified: Secondary | ICD-10-CM

## 2022-01-12 DIAGNOSIS — F32A Depression, unspecified: Secondary | ICD-10-CM | POA: Diagnosis not present

## 2022-01-12 DIAGNOSIS — R7989 Other specified abnormal findings of blood chemistry: Secondary | ICD-10-CM

## 2022-01-12 DIAGNOSIS — I480 Paroxysmal atrial fibrillation: Secondary | ICD-10-CM | POA: Diagnosis not present

## 2022-01-12 DIAGNOSIS — E785 Hyperlipidemia, unspecified: Secondary | ICD-10-CM

## 2022-01-12 DIAGNOSIS — E039 Hypothyroidism, unspecified: Secondary | ICD-10-CM | POA: Diagnosis not present

## 2022-01-12 DIAGNOSIS — K219 Gastro-esophageal reflux disease without esophagitis: Secondary | ICD-10-CM

## 2022-01-12 DIAGNOSIS — S72001D Fracture of unspecified part of neck of right femur, subsequent encounter for closed fracture with routine healing: Secondary | ICD-10-CM | POA: Diagnosis not present

## 2022-01-12 DIAGNOSIS — D62 Acute posthemorrhagic anemia: Secondary | ICD-10-CM | POA: Diagnosis not present

## 2022-01-13 NOTE — Progress Notes (Signed)
Attempted to contact pt.  No answer and no VM.  T. Winfrey Chillemi, CMA 

## 2022-01-14 DIAGNOSIS — I10 Essential (primary) hypertension: Secondary | ICD-10-CM | POA: Diagnosis not present

## 2022-01-14 DIAGNOSIS — Z7901 Long term (current) use of anticoagulants: Secondary | ICD-10-CM | POA: Diagnosis not present

## 2022-01-14 DIAGNOSIS — I69311 Memory deficit following cerebral infarction: Secondary | ICD-10-CM | POA: Diagnosis not present

## 2022-01-14 DIAGNOSIS — S72001D Fracture of unspecified part of neck of right femur, subsequent encounter for closed fracture with routine healing: Secondary | ICD-10-CM | POA: Diagnosis not present

## 2022-01-14 DIAGNOSIS — F32A Depression, unspecified: Secondary | ICD-10-CM | POA: Diagnosis not present

## 2022-01-14 DIAGNOSIS — Z853 Personal history of malignant neoplasm of breast: Secondary | ICD-10-CM | POA: Diagnosis not present

## 2022-01-14 DIAGNOSIS — I480 Paroxysmal atrial fibrillation: Secondary | ICD-10-CM | POA: Diagnosis not present

## 2022-01-14 DIAGNOSIS — Z96641 Presence of right artificial hip joint: Secondary | ICD-10-CM | POA: Diagnosis not present

## 2022-01-14 DIAGNOSIS — E039 Hypothyroidism, unspecified: Secondary | ICD-10-CM | POA: Diagnosis not present

## 2022-01-14 NOTE — Progress Notes (Signed)
Attempted to contact pt and pt's spouse.  No answer and no VM at both phone numbers.  Charyl Bigger, CMA

## 2022-01-18 NOTE — Progress Notes (Signed)
Attempted to contact pt at both numbers listed on profile.  No answer and no VM.  Charyl Bigger, CMA

## 2022-01-19 DIAGNOSIS — I69311 Memory deficit following cerebral infarction: Secondary | ICD-10-CM | POA: Diagnosis not present

## 2022-01-19 DIAGNOSIS — I480 Paroxysmal atrial fibrillation: Secondary | ICD-10-CM | POA: Diagnosis not present

## 2022-01-19 DIAGNOSIS — I10 Essential (primary) hypertension: Secondary | ICD-10-CM | POA: Diagnosis not present

## 2022-01-19 DIAGNOSIS — Z96641 Presence of right artificial hip joint: Secondary | ICD-10-CM | POA: Diagnosis not present

## 2022-01-19 DIAGNOSIS — Z853 Personal history of malignant neoplasm of breast: Secondary | ICD-10-CM | POA: Diagnosis not present

## 2022-01-19 DIAGNOSIS — S72001D Fracture of unspecified part of neck of right femur, subsequent encounter for closed fracture with routine healing: Secondary | ICD-10-CM | POA: Diagnosis not present

## 2022-01-19 DIAGNOSIS — E039 Hypothyroidism, unspecified: Secondary | ICD-10-CM | POA: Diagnosis not present

## 2022-01-19 DIAGNOSIS — F32A Depression, unspecified: Secondary | ICD-10-CM | POA: Diagnosis not present

## 2022-01-19 DIAGNOSIS — Z7901 Long term (current) use of anticoagulants: Secondary | ICD-10-CM | POA: Diagnosis not present

## 2022-01-20 NOTE — Progress Notes (Signed)
Attempted to contact pt at both numbers listed on pt's profile.  No answer and no VM.  Charyl Bigger, CMA

## 2022-01-21 DIAGNOSIS — S72001D Fracture of unspecified part of neck of right femur, subsequent encounter for closed fracture with routine healing: Secondary | ICD-10-CM | POA: Diagnosis not present

## 2022-01-21 DIAGNOSIS — Z853 Personal history of malignant neoplasm of breast: Secondary | ICD-10-CM | POA: Diagnosis not present

## 2022-01-21 DIAGNOSIS — Z96641 Presence of right artificial hip joint: Secondary | ICD-10-CM | POA: Diagnosis not present

## 2022-01-21 DIAGNOSIS — Z7901 Long term (current) use of anticoagulants: Secondary | ICD-10-CM | POA: Diagnosis not present

## 2022-01-21 DIAGNOSIS — F32A Depression, unspecified: Secondary | ICD-10-CM | POA: Diagnosis not present

## 2022-01-21 DIAGNOSIS — I10 Essential (primary) hypertension: Secondary | ICD-10-CM | POA: Diagnosis not present

## 2022-01-21 DIAGNOSIS — I480 Paroxysmal atrial fibrillation: Secondary | ICD-10-CM | POA: Diagnosis not present

## 2022-01-21 DIAGNOSIS — E039 Hypothyroidism, unspecified: Secondary | ICD-10-CM | POA: Diagnosis not present

## 2022-01-21 DIAGNOSIS — I69311 Memory deficit following cerebral infarction: Secondary | ICD-10-CM | POA: Diagnosis not present

## 2022-01-26 DIAGNOSIS — E039 Hypothyroidism, unspecified: Secondary | ICD-10-CM | POA: Diagnosis not present

## 2022-01-26 DIAGNOSIS — Z853 Personal history of malignant neoplasm of breast: Secondary | ICD-10-CM | POA: Diagnosis not present

## 2022-01-26 DIAGNOSIS — I69311 Memory deficit following cerebral infarction: Secondary | ICD-10-CM | POA: Diagnosis not present

## 2022-01-26 DIAGNOSIS — I10 Essential (primary) hypertension: Secondary | ICD-10-CM | POA: Diagnosis not present

## 2022-01-26 DIAGNOSIS — F32A Depression, unspecified: Secondary | ICD-10-CM | POA: Diagnosis not present

## 2022-01-26 DIAGNOSIS — I480 Paroxysmal atrial fibrillation: Secondary | ICD-10-CM | POA: Diagnosis not present

## 2022-01-26 DIAGNOSIS — Z7901 Long term (current) use of anticoagulants: Secondary | ICD-10-CM | POA: Diagnosis not present

## 2022-01-26 DIAGNOSIS — S72001D Fracture of unspecified part of neck of right femur, subsequent encounter for closed fracture with routine healing: Secondary | ICD-10-CM | POA: Diagnosis not present

## 2022-01-26 DIAGNOSIS — Z96641 Presence of right artificial hip joint: Secondary | ICD-10-CM | POA: Diagnosis not present

## 2022-01-26 NOTE — Progress Notes (Signed)
Sent patient a MyChart message

## 2022-02-01 DIAGNOSIS — Z7901 Long term (current) use of anticoagulants: Secondary | ICD-10-CM | POA: Diagnosis not present

## 2022-02-01 DIAGNOSIS — Z96641 Presence of right artificial hip joint: Secondary | ICD-10-CM | POA: Diagnosis not present

## 2022-02-01 DIAGNOSIS — I10 Essential (primary) hypertension: Secondary | ICD-10-CM | POA: Diagnosis not present

## 2022-02-01 DIAGNOSIS — E039 Hypothyroidism, unspecified: Secondary | ICD-10-CM | POA: Diagnosis not present

## 2022-02-01 DIAGNOSIS — I69311 Memory deficit following cerebral infarction: Secondary | ICD-10-CM | POA: Diagnosis not present

## 2022-02-01 DIAGNOSIS — Z853 Personal history of malignant neoplasm of breast: Secondary | ICD-10-CM | POA: Diagnosis not present

## 2022-02-01 DIAGNOSIS — S72001D Fracture of unspecified part of neck of right femur, subsequent encounter for closed fracture with routine healing: Secondary | ICD-10-CM | POA: Diagnosis not present

## 2022-02-01 DIAGNOSIS — F32A Depression, unspecified: Secondary | ICD-10-CM | POA: Diagnosis not present

## 2022-02-01 DIAGNOSIS — I480 Paroxysmal atrial fibrillation: Secondary | ICD-10-CM | POA: Diagnosis not present

## 2022-02-11 DIAGNOSIS — Z7901 Long term (current) use of anticoagulants: Secondary | ICD-10-CM | POA: Diagnosis not present

## 2022-02-11 DIAGNOSIS — E039 Hypothyroidism, unspecified: Secondary | ICD-10-CM | POA: Diagnosis not present

## 2022-02-11 DIAGNOSIS — I10 Essential (primary) hypertension: Secondary | ICD-10-CM | POA: Diagnosis not present

## 2022-02-11 DIAGNOSIS — I480 Paroxysmal atrial fibrillation: Secondary | ICD-10-CM | POA: Diagnosis not present

## 2022-02-11 DIAGNOSIS — F32A Depression, unspecified: Secondary | ICD-10-CM | POA: Diagnosis not present

## 2022-02-11 DIAGNOSIS — Z853 Personal history of malignant neoplasm of breast: Secondary | ICD-10-CM | POA: Diagnosis not present

## 2022-02-11 DIAGNOSIS — S72001D Fracture of unspecified part of neck of right femur, subsequent encounter for closed fracture with routine healing: Secondary | ICD-10-CM | POA: Diagnosis not present

## 2022-02-11 DIAGNOSIS — Z96641 Presence of right artificial hip joint: Secondary | ICD-10-CM | POA: Diagnosis not present

## 2022-02-11 DIAGNOSIS — I69311 Memory deficit following cerebral infarction: Secondary | ICD-10-CM | POA: Diagnosis not present

## 2022-03-12 ENCOUNTER — Other Ambulatory Visit: Payer: Self-pay

## 2022-03-12 DIAGNOSIS — E785 Hyperlipidemia, unspecified: Secondary | ICD-10-CM

## 2022-03-12 DIAGNOSIS — K219 Gastro-esophageal reflux disease without esophagitis: Secondary | ICD-10-CM

## 2022-03-12 MED ORDER — OMEPRAZOLE 20 MG PO CPDR: 20.0000 mg | DELAYED_RELEASE_CAPSULE | Freq: Every day | ORAL | 1 refills | Status: DC

## 2022-03-12 MED ORDER — PAROXETINE HCL 40 MG PO TABS
40.0000 mg | ORAL_TABLET | ORAL | 1 refills | Status: DC
Start: 2022-03-12 — End: 2022-08-17

## 2022-03-12 NOTE — Progress Notes (Signed)
Patient Last seen 01/12/2022 for a Hospital Follow up.   Has an appointment 07/20/2022 for a 6 months follow up.   Simvastatin Last filled 06/30/2021 expired 01/12/2022 - Prescribed by Caleen Jobs  Omeprazole Last filled 06/30/2021 - Prescribed by Caleen Jobs  Paroxetine - Last filled unknown Historical Provider

## 2022-03-12 NOTE — Progress Notes (Signed)
Per her most recent medication reconciliation, she is no longer on simvastatin as she was changed to atorvastatin.  Refills sent for Paxil and omeprazole. ___________________________________________ Clearnce Sorrel, DNP, APRN, FNP-BC [facility] [place]

## 2022-05-05 ENCOUNTER — Other Ambulatory Visit: Payer: Self-pay

## 2022-05-05 DIAGNOSIS — E785 Hyperlipidemia, unspecified: Secondary | ICD-10-CM

## 2022-05-05 NOTE — Progress Notes (Unsigned)
[facility] sent a fax requesting a new prescription for this medication.  It is not listed in her current medication list

## 2022-05-20 ENCOUNTER — Other Ambulatory Visit: Payer: Self-pay | Admitting: Medical-Surgical

## 2022-05-20 DIAGNOSIS — K219 Gastro-esophageal reflux disease without esophagitis: Secondary | ICD-10-CM

## 2022-05-25 ENCOUNTER — Other Ambulatory Visit: Payer: Self-pay | Admitting: Medical-Surgical

## 2022-05-25 NOTE — Telephone Encounter (Signed)
Patient last seen by provider. Unable to send in refills. Rxs written by historical provider. Please advise, thanks.

## 2022-05-26 ENCOUNTER — Other Ambulatory Visit (HOSPITAL_COMMUNITY): Payer: Self-pay

## 2022-05-26 MED ORDER — LEVOTHYROXINE SODIUM 100 MCG PO TABS
100.0000 ug | ORAL_TABLET | Freq: Every day | ORAL | 0 refills | Status: DC
  Filled 2022-05-26 – 2022-06-22 (×2): qty 30, 30d supply, fill #0

## 2022-05-26 MED ORDER — DIVALPROEX SODIUM 125 MG PO CSDR
125.0000 mg | DELAYED_RELEASE_CAPSULE | Freq: Every evening | ORAL | 0 refills | Status: DC
  Filled 2022-05-26 – 2022-06-22 (×2): qty 30, 30d supply, fill #0

## 2022-05-26 MED ORDER — AMLODIPINE BESYLATE 2.5 MG PO TABS
2.5000 mg | ORAL_TABLET | Freq: Every day | ORAL | 0 refills | Status: DC
  Filled 2022-05-26 – 2022-06-22 (×2): qty 30, 30d supply, fill #0

## 2022-05-26 MED ORDER — AMIODARONE HCL 200 MG PO TABS
200.0000 mg | ORAL_TABLET | Freq: Two times a day (BID) | ORAL | 0 refills | Status: DC
  Filled 2022-05-26 – 2022-06-22 (×2): qty 60, 30d supply, fill #0

## 2022-05-26 NOTE — Telephone Encounter (Signed)
30-day supply sent to the pharmacy as requested.  She will need to transfer care for to a new PCP for further refills.  Appointment scheduled for December needs to be moved up to an as soon as possible appointment with either myself or to establish with Dr. Mel Almond.

## 2022-05-26 NOTE — Telephone Encounter (Signed)
Scheduled with Samuel Bouche for 06/21/2022- tvt

## 2022-05-26 NOTE — Telephone Encounter (Signed)
Scheduled patient for 06/21/2022- Transfer of care. tvt

## 2022-05-27 ENCOUNTER — Other Ambulatory Visit (HOSPITAL_COMMUNITY): Payer: Self-pay

## 2022-05-28 ENCOUNTER — Other Ambulatory Visit (HOSPITAL_COMMUNITY): Payer: Self-pay

## 2022-05-31 ENCOUNTER — Other Ambulatory Visit: Payer: Self-pay | Admitting: Family Medicine

## 2022-05-31 DIAGNOSIS — E039 Hypothyroidism, unspecified: Secondary | ICD-10-CM

## 2022-06-02 ENCOUNTER — Encounter (HOSPITAL_COMMUNITY): Payer: Self-pay | Admitting: Pharmacist

## 2022-06-02 ENCOUNTER — Other Ambulatory Visit (HOSPITAL_COMMUNITY): Payer: Self-pay

## 2022-06-07 ENCOUNTER — Other Ambulatory Visit (HOSPITAL_COMMUNITY): Payer: Self-pay

## 2022-06-20 NOTE — Progress Notes (Unsigned)
   Established Patient Office Visit  Subjective   Patient ID: Angela Myers, female   DOB: [birth year] Age: 81 y.o. MRN: [id number]   No chief complaint on file.   HPI    Objective:    There were no vitals filed for this visit.  Physical Exam   No results found for this or any previous visit (from the past 24 hour(s)).   {Labs (Optional):23779}  The ASCVD Risk score (Arnett DK, et al., 2019) failed to calculate for the following reasons:   The 2019 ASCVD risk score is only valid for ages 101 to 67   Assessment & Plan:   No problem-specific Assessment & Plan notes found for this encounter.   No follow-ups on file.  ___________________________________________ Clearnce Sorrel, DNP, APRN, FNP-BC [facility] and [place]

## 2022-06-21 ENCOUNTER — Encounter: Payer: Medicare Other | Admitting: Medical-Surgical

## 2022-06-21 DIAGNOSIS — K219 Gastro-esophageal reflux disease without esophagitis: Secondary | ICD-10-CM

## 2022-06-21 DIAGNOSIS — F419 Anxiety disorder, unspecified: Secondary | ICD-10-CM

## 2022-06-21 DIAGNOSIS — Z23 Encounter for immunization: Secondary | ICD-10-CM

## 2022-06-21 DIAGNOSIS — L409 Psoriasis, unspecified: Secondary | ICD-10-CM

## 2022-06-21 DIAGNOSIS — F0390 Unspecified dementia without behavioral disturbance: Secondary | ICD-10-CM

## 2022-06-21 DIAGNOSIS — E039 Hypothyroidism, unspecified: Secondary | ICD-10-CM

## 2022-06-22 ENCOUNTER — Other Ambulatory Visit (HOSPITAL_COMMUNITY): Payer: Self-pay

## 2022-06-22 ENCOUNTER — Other Ambulatory Visit: Payer: Self-pay

## 2022-06-22 MED ORDER — ATORVASTATIN CALCIUM 20 MG PO TABS: 20.0000 mg | ORAL_TABLET | Freq: Every day | ORAL | 0 refills | Status: DC

## 2022-06-23 ENCOUNTER — Other Ambulatory Visit (HOSPITAL_COMMUNITY): Payer: Self-pay

## 2022-06-24 ENCOUNTER — Encounter (HOSPITAL_COMMUNITY): Payer: Self-pay

## 2022-06-24 ENCOUNTER — Other Ambulatory Visit (HOSPITAL_COMMUNITY): Payer: Self-pay

## 2022-07-15 ENCOUNTER — Other Ambulatory Visit: Payer: Self-pay | Admitting: Medical-Surgical

## 2022-07-16 ENCOUNTER — Ambulatory Visit: Payer: Medicare Other | Admitting: Medical-Surgical

## 2022-07-20 ENCOUNTER — Ambulatory Visit: Payer: Medicare Other | Admitting: Medical-Surgical

## 2022-07-26 ENCOUNTER — Other Ambulatory Visit: Payer: Self-pay | Admitting: Medical-Surgical

## 2022-07-27 ENCOUNTER — Other Ambulatory Visit (HOSPITAL_COMMUNITY): Payer: Self-pay

## 2022-07-27 MED ORDER — LEVOTHYROXINE SODIUM 100 MCG PO TABS
100.0000 ug | ORAL_TABLET | Freq: Every day | ORAL | 0 refills | Status: DC
  Filled 2022-07-27 – 2022-07-30 (×2): qty 30, 30d supply, fill #0

## 2022-07-27 MED ORDER — DIVALPROEX SODIUM 125 MG PO CSDR
125.0000 mg | DELAYED_RELEASE_CAPSULE | Freq: Every evening | ORAL | 0 refills | Status: DC
  Filled 2022-07-27 – 2022-08-03 (×4): qty 30, 30d supply, fill #0

## 2022-07-27 MED ORDER — AMLODIPINE BESYLATE 2.5 MG PO TABS
2.5000 mg | ORAL_TABLET | Freq: Every day | ORAL | 0 refills | Status: DC
  Filled 2022-07-27 – 2022-07-30 (×2): qty 30, 30d supply, fill #0

## 2022-07-30 ENCOUNTER — Other Ambulatory Visit: Payer: Self-pay

## 2022-07-30 ENCOUNTER — Other Ambulatory Visit (HOSPITAL_COMMUNITY): Payer: Self-pay

## 2022-08-03 ENCOUNTER — Other Ambulatory Visit (HOSPITAL_COMMUNITY): Payer: Self-pay

## 2022-08-03 ENCOUNTER — Other Ambulatory Visit: Payer: Self-pay

## 2022-08-04 ENCOUNTER — Other Ambulatory Visit (HOSPITAL_COMMUNITY): Payer: Self-pay

## 2022-08-04 ENCOUNTER — Other Ambulatory Visit: Payer: Self-pay

## 2022-08-04 ENCOUNTER — Encounter (HOSPITAL_COMMUNITY): Payer: Self-pay

## 2022-08-11 ENCOUNTER — Other Ambulatory Visit: Payer: Self-pay

## 2022-08-17 ENCOUNTER — Ambulatory Visit (INDEPENDENT_AMBULATORY_CARE_PROVIDER_SITE_OTHER): Payer: Medicare Other | Admitting: Medical-Surgical

## 2022-08-17 ENCOUNTER — Encounter: Payer: Self-pay | Admitting: Medical-Surgical

## 2022-08-17 VITALS — BP 144/67 | HR 83 | Resp 20 | Ht 64.0 in | Wt 121.9 lb

## 2022-08-17 DIAGNOSIS — Z23 Encounter for immunization: Secondary | ICD-10-CM | POA: Diagnosis not present

## 2022-08-17 DIAGNOSIS — E039 Hypothyroidism, unspecified: Secondary | ICD-10-CM | POA: Diagnosis not present

## 2022-08-17 DIAGNOSIS — Z7689 Persons encountering health services in other specified circumstances: Secondary | ICD-10-CM

## 2022-08-17 DIAGNOSIS — R209 Unspecified disturbances of skin sensation: Secondary | ICD-10-CM | POA: Insufficient documentation

## 2022-08-17 DIAGNOSIS — I480 Paroxysmal atrial fibrillation: Secondary | ICD-10-CM

## 2022-08-17 DIAGNOSIS — I10 Essential (primary) hypertension: Secondary | ICD-10-CM | POA: Diagnosis not present

## 2022-08-17 DIAGNOSIS — F0390 Unspecified dementia without behavioral disturbance: Secondary | ICD-10-CM

## 2022-08-17 DIAGNOSIS — K219 Gastro-esophageal reflux disease without esophagitis: Secondary | ICD-10-CM

## 2022-08-17 DIAGNOSIS — E785 Hyperlipidemia, unspecified: Secondary | ICD-10-CM

## 2022-08-17 MED ORDER — AMIODARONE HCL 200 MG PO TABS: 200.0000 mg | ORAL_TABLET | Freq: Two times a day (BID) | ORAL | 0 refills | Status: DC

## 2022-08-17 MED ORDER — ATORVASTATIN CALCIUM 20 MG PO TABS: 20.0000 mg | ORAL_TABLET | Freq: Every day | ORAL | 0 refills | Status: DC

## 2022-08-17 MED ORDER — OMEPRAZOLE 20 MG PO CPDR: 20.0000 mg | DELAYED_RELEASE_CAPSULE | Freq: Every day | ORAL | 1 refills | Status: DC

## 2022-08-17 MED ORDER — PAROXETINE HCL 40 MG PO TABS: 40.0000 mg | ORAL_TABLET | ORAL | 1 refills | Status: DC

## 2022-08-17 MED ORDER — LEVOTHYROXINE SODIUM 100 MCG PO TABS: 100.0000 ug | ORAL_TABLET | Freq: Every day | ORAL | 0 refills | Status: DC

## 2022-08-17 MED ORDER — DIVALPROEX SODIUM 125 MG PO CSDR: 125.0000 mg | DELAYED_RELEASE_CAPSULE | Freq: Every evening | ORAL | 0 refills | Status: DC

## 2022-08-17 MED ORDER — AMLODIPINE BESYLATE 2.5 MG PO TABS: 2.5000 mg | ORAL_TABLET | Freq: Every day | ORAL | 0 refills | Status: DC

## 2022-08-17 NOTE — Progress Notes (Signed)
Established Patient Office Visit  Subjective   Patient ID: Angela Myers, female   DOB: 1940/11/18 Age: 82 y.o. MRN: [id number]   Chief Complaint  Patient presents with   Transitions Of Care   HPI Pleasant 82 year old female accompanied by her husband presenting for transfer of care to a new PCP and for the following:  A-fib/hypertension: Taking amlodipine 2.5 mg daily and amiodarone 200 mg twice daily.  Reports that she is not compliant with the regimen and often has to be pushed into taking her pills.  Has not had her medications today.  Not regularly monitoring blood pressure at home.  Following a low-sodium diet.  No regular intentional exercise. Denies CP, SOB, palpitations, lower extremity edema, dizziness, headaches, or vision changes.  Hyperlipidemia: Taking atorvastatin 20 mg daily, tolerating well without side effects.  Hypothyroidism: Taking levothyroxine 100 mcg daily, tolerating well without side effects.  Is due for recheck of TSH.  Mood: Taking paroxetine 40 mg daily and Depakote Sprinkle 125 mg once daily.  Tolerating both medications well.  She is doing fairly well on mood however dementia does seem to play a role in this.  Her husband reports that she is starting to misplace things around the house and he has to constantly go look for things, often in unusual places.   Objective:    Vitals:   08/17/22 1530  BP: (!) 144/67  Pulse: 83  Resp: 20  Height: '5\' 4"'$  (1.626 m)  Weight: 121 lb 14.4 oz (55.3 kg)  SpO2: 95%  BMI (Calculated): 20.91    Physical Exam Vitals reviewed.  Constitutional:      General: She is not in acute distress.    Appearance: Normal appearance. She is normal weight. She is not ill-appearing.  HENT:     Head: Normocephalic and atraumatic.  Cardiovascular:     Rate and Rhythm: Normal rate and regular rhythm.     Pulses: Normal pulses.     Heart sounds: Normal heart sounds.  Pulmonary:     Effort: Pulmonary effort is normal. No  respiratory distress.     Breath sounds: Normal breath sounds. No wheezing, rhonchi or rales.  Skin:    General: Skin is warm and dry.  Neurological:     Mental Status: She is alert. Mental status is at baseline.     Comments: Oriented to person and place, disoriented to day/date.  Psychiatric:        Mood and Affect: Mood normal.        Behavior: Behavior normal.        Thought Content: Thought content normal.        Judgment: Judgment normal.   No results found for this or any previous visit (from the past 24 hour(s)).     The ASCVD Risk score (Arnett DK, et al., 2019) failed to calculate for the following reasons:   The 2019 ASCVD risk score is only valid for ages 9 to 89   Assessment & Plan:   1. Encounter to establish care Reviewed available information and discussed care concerns with patient.   2. Gastroesophageal reflux disease, unspecified whether esophagitis present Stable.  Continue omeprazole 20 mg daily. - omeprazole (PRILOSEC) 20 MG capsule; Take 1 capsule (20 mg total) by mouth daily.  Dispense: 90 capsule; Refill: 1  3. Primary hypertension Blood pressure slightly elevated today however she has not had her medication.  Continue amlodipine 2.5 mg daily.  Checking labs as below. - CBC with Differential/Platelet - COMPLETE  METABOLIC PANEL WITH GFR - Lipid panel  4. Paroxysmal atrial fibrillation (HCC) Continue amiodarone 200 mg twice daily as prescribed.  If this has importance in taking this medication regularly.  5. Hypothyroidism, unspecified type Checking TSH. - TSH  6. Dementia without behavioral disturbance, psychotic disturbance, mood disturbance, or anxiety, unspecified dementia severity, unspecified dementia type ([place]) Unfortunately, her dementia is progressing a bit.  She is recalcitrant regarding her medications so advised her to be more adherent to her regimen.  Discussed possible care needs with her husband.  If he gets to a point where he feels  like he needs caregiver support in the home, he will let me know and I will be glad to place a referral for social work.  7. Hyperlipidemia, unspecified hyperlipidemia type Checking labs as below.  Continue atorvastatin 20 mg daily. - COMPLETE METABOLIC PANEL WITH GFR - Lipid panel  8. Need for COVID-19 vaccine COVID-vaccine given in office. - Pfizer Fall 2023 Covid-19 Vaccine 63yr and older  9. Need for influenza vaccination Flu vaccine given in office today. - Flu Vaccine QUAD High Dose(Fluad)   Return in about 6 months (around 02/15/2023) for chronic disease follow up.  ___________________________________________ JClearnce Sorrel DNP, APRN, FNP-BC [facility] [place]

## 2022-08-18 ENCOUNTER — Other Ambulatory Visit: Payer: Self-pay

## 2022-08-18 DIAGNOSIS — E039 Hypothyroidism, unspecified: Secondary | ICD-10-CM

## 2022-08-18 LAB — CBC WITH DIFFERENTIAL/PLATELET
Absolute Monocytes: 495 cells/uL (ref 200–950)
Basophils Absolute: 59 cells/uL (ref 0–200)
Basophils Relative: 0.9 %
Eosinophils Absolute: 79 cells/uL (ref 15–500)
Eosinophils Relative: 1.2 %
HCT: 29.7 % — ABNORMAL LOW (ref 35.0–45.0)
Hemoglobin: 9.8 g/dL — ABNORMAL LOW (ref 11.7–15.5)
Lymphs Abs: 1188 cells/uL (ref 850–3900)
MCH: 31.1 pg (ref 27.0–33.0)
MCHC: 33 g/dL (ref 32.0–36.0)
MCV: 94.3 fL (ref 80.0–100.0)
MPV: 10.3 fL (ref 7.5–12.5)
Monocytes Relative: 7.5 %
Neutro Abs: 4778 cells/uL (ref 1500–7800)
Neutrophils Relative %: 72.4 %
Platelets: 253 10*3/uL (ref 140–400)
RBC: 3.15 10*6/uL — ABNORMAL LOW (ref 3.80–5.10)
RDW: 12.4 % (ref 11.0–15.0)
Total Lymphocyte: 18 %
WBC: 6.6 10*3/uL (ref 3.8–10.8)

## 2022-08-18 LAB — TSH: TSH: 12.23 mIU/L — ABNORMAL HIGH (ref 0.40–4.50)

## 2022-08-18 LAB — COMPLETE METABOLIC PANEL WITH GFR
AG Ratio: 1.8 (calc) (ref 1.0–2.5)
ALT: 9 U/L (ref 6–29)
AST: 15 U/L (ref 10–35)
Albumin: 4.3 g/dL (ref 3.6–5.1)
Alkaline phosphatase (APISO): 67 U/L (ref 37–153)
BUN/Creatinine Ratio: 15 (calc) (ref 6–22)
BUN: 20 mg/dL (ref 7–25)
CO2: 29 mmol/L (ref 20–32)
Calcium: 9 mg/dL (ref 8.6–10.4)
Chloride: 106 mmol/L (ref 98–110)
Creat: 1.3 mg/dL — ABNORMAL HIGH (ref 0.60–0.95)
Globulin: 2.4 g/dL (calc) (ref 1.9–3.7)
Glucose, Bld: 103 mg/dL — ABNORMAL HIGH (ref 65–99)
Potassium: 4.6 mmol/L (ref 3.5–5.3)
Sodium: 143 mmol/L (ref 135–146)
Total Bilirubin: 0.5 mg/dL (ref 0.2–1.2)
Total Protein: 6.7 g/dL (ref 6.1–8.1)
eGFR: 42 mL/min/{1.73_m2} — ABNORMAL LOW (ref >=60)

## 2022-08-18 LAB — LIPID PANEL
Cholesterol: 166 mg/dL (ref <200)
HDL: 75 mg/dL (ref >=50)
LDL Cholesterol (Calc): 74 mg/dL (calc)
Non-HDL Cholesterol (Calc): 91 mg/dL (calc) (ref <130)
Total CHOL/HDL Ratio: 2.2 (calc) (ref <5.0)
Triglycerides: 88 mg/dL (ref <150)

## 2022-09-06 ENCOUNTER — Other Ambulatory Visit: Payer: Self-pay | Admitting: Medical-Surgical

## 2022-09-13 ENCOUNTER — Other Ambulatory Visit: Payer: Self-pay | Admitting: Medical-Surgical

## 2022-09-15 ENCOUNTER — Other Ambulatory Visit: Payer: Self-pay | Admitting: Medical-Surgical

## 2022-09-16 ENCOUNTER — Telehealth: Payer: Self-pay | Admitting: Medical-Surgical

## 2022-09-16 NOTE — Telephone Encounter (Signed)
Angela Myers from [facility] came into the clinic requesting Home Health referral for Hospice. Patients family contacted her and would like to have services started. Please advise.

## 2022-09-17 NOTE — Telephone Encounter (Signed)
Per Severiano Gilbert, the family reached out to [facility]. After their discussion, Ms. Tysinger did recommend hospice care from the information received by the family and patient changes. Ms. Glade Lloyd will reach out to the family to discuss setting up an appointment as soon as with the provider regarding the new findings. Direct call back information provided.

## 2022-10-25 ENCOUNTER — Other Ambulatory Visit: Payer: Self-pay | Admitting: Medical-Surgical

## 2022-11-10 DIAGNOSIS — F0394 Unspecified dementia, unspecified severity, with anxiety: Secondary | ICD-10-CM | POA: Diagnosis not present

## 2022-11-10 DIAGNOSIS — F32A Depression, unspecified: Secondary | ICD-10-CM | POA: Diagnosis not present

## 2022-11-10 DIAGNOSIS — R6889 Other general symptoms and signs: Secondary | ICD-10-CM | POA: Diagnosis not present

## 2022-11-10 DIAGNOSIS — Z743 Need for continuous supervision: Secondary | ICD-10-CM | POA: Diagnosis not present

## 2022-11-10 DIAGNOSIS — D539 Nutritional anemia, unspecified: Secondary | ICD-10-CM | POA: Diagnosis not present

## 2022-11-10 DIAGNOSIS — E860 Dehydration: Secondary | ICD-10-CM | POA: Diagnosis not present

## 2022-11-10 DIAGNOSIS — S72042A Displaced fracture of base of neck of left femur, initial encounter for closed fracture: Secondary | ICD-10-CM | POA: Diagnosis not present

## 2022-11-10 DIAGNOSIS — Z96643 Presence of artificial hip joint, bilateral: Secondary | ICD-10-CM | POA: Diagnosis not present

## 2022-11-10 DIAGNOSIS — W1830XA Fall on same level, unspecified, initial encounter: Secondary | ICD-10-CM | POA: Diagnosis not present

## 2022-11-10 DIAGNOSIS — S72002A Fracture of unspecified part of neck of left femur, initial encounter for closed fracture: Secondary | ICD-10-CM | POA: Diagnosis not present

## 2022-11-10 DIAGNOSIS — Z751 Person awaiting admission to adequate facility elsewhere: Secondary | ICD-10-CM | POA: Diagnosis not present

## 2022-11-10 DIAGNOSIS — K219 Gastro-esophageal reflux disease without esophagitis: Secondary | ICD-10-CM | POA: Diagnosis not present

## 2022-11-10 DIAGNOSIS — E039 Hypothyroidism, unspecified: Secondary | ICD-10-CM | POA: Diagnosis not present

## 2022-11-10 DIAGNOSIS — Z4789 Encounter for other orthopedic aftercare: Secondary | ICD-10-CM | POA: Diagnosis not present

## 2022-11-10 DIAGNOSIS — Z1152 Encounter for screening for COVID-19: Secondary | ICD-10-CM | POA: Diagnosis not present

## 2022-11-10 DIAGNOSIS — S72001D Fracture of unspecified part of neck of right femur, subsequent encounter for closed fracture with routine healing: Secondary | ICD-10-CM | POA: Diagnosis not present

## 2022-11-10 DIAGNOSIS — R079 Chest pain, unspecified: Secondary | ICD-10-CM | POA: Diagnosis not present

## 2022-11-10 DIAGNOSIS — N189 Chronic kidney disease, unspecified: Secondary | ICD-10-CM | POA: Diagnosis not present

## 2022-11-10 DIAGNOSIS — N3281 Overactive bladder: Secondary | ICD-10-CM | POA: Diagnosis not present

## 2022-11-10 DIAGNOSIS — I480 Paroxysmal atrial fibrillation: Secondary | ICD-10-CM | POA: Diagnosis not present

## 2022-11-10 DIAGNOSIS — Z888 Allergy status to other drugs, medicaments and biological substances status: Secondary | ICD-10-CM | POA: Diagnosis not present

## 2022-11-10 DIAGNOSIS — Z87891 Personal history of nicotine dependence: Secondary | ICD-10-CM | POA: Diagnosis not present

## 2022-11-10 DIAGNOSIS — E785 Hyperlipidemia, unspecified: Secondary | ICD-10-CM | POA: Diagnosis not present

## 2022-11-10 DIAGNOSIS — Z8673 Personal history of transient ischemic attack (TIA), and cerebral infarction without residual deficits: Secondary | ICD-10-CM | POA: Diagnosis not present

## 2022-11-10 DIAGNOSIS — S72142D Displaced intertrochanteric fracture of left femur, subsequent encounter for closed fracture with routine healing: Secondary | ICD-10-CM | POA: Diagnosis not present

## 2022-11-10 DIAGNOSIS — F0393 Unspecified dementia, unspecified severity, with mood disturbance: Secondary | ICD-10-CM | POA: Diagnosis not present

## 2022-11-10 DIAGNOSIS — I10 Essential (primary) hypertension: Secondary | ICD-10-CM | POA: Diagnosis not present

## 2022-11-10 DIAGNOSIS — I129 Hypertensive chronic kidney disease with stage 1 through stage 4 chronic kidney disease, or unspecified chronic kidney disease: Secondary | ICD-10-CM | POA: Diagnosis not present

## 2022-11-10 DIAGNOSIS — Z79899 Other long term (current) drug therapy: Secondary | ICD-10-CM | POA: Diagnosis not present

## 2022-11-11 DIAGNOSIS — I129 Hypertensive chronic kidney disease with stage 1 through stage 4 chronic kidney disease, or unspecified chronic kidney disease: Secondary | ICD-10-CM | POA: Diagnosis not present

## 2022-11-11 DIAGNOSIS — K219 Gastro-esophageal reflux disease without esophagitis: Secondary | ICD-10-CM | POA: Diagnosis not present

## 2022-11-11 DIAGNOSIS — S72002A Fracture of unspecified part of neck of left femur, initial encounter for closed fracture: Secondary | ICD-10-CM | POA: Diagnosis not present

## 2022-11-11 DIAGNOSIS — I480 Paroxysmal atrial fibrillation: Secondary | ICD-10-CM | POA: Diagnosis not present

## 2022-11-11 DIAGNOSIS — N189 Chronic kidney disease, unspecified: Secondary | ICD-10-CM | POA: Diagnosis not present

## 2022-11-16 DIAGNOSIS — F32A Depression, unspecified: Secondary | ICD-10-CM | POA: Diagnosis not present

## 2022-11-16 DIAGNOSIS — I480 Paroxysmal atrial fibrillation: Secondary | ICD-10-CM | POA: Diagnosis not present

## 2022-11-16 DIAGNOSIS — Z79899 Other long term (current) drug therapy: Secondary | ICD-10-CM | POA: Diagnosis not present

## 2022-11-16 DIAGNOSIS — F0393 Unspecified dementia, unspecified severity, with mood disturbance: Secondary | ICD-10-CM | POA: Diagnosis not present

## 2022-11-16 DIAGNOSIS — Z7982 Long term (current) use of aspirin: Secondary | ICD-10-CM | POA: Diagnosis not present

## 2022-11-16 DIAGNOSIS — Z87891 Personal history of nicotine dependence: Secondary | ICD-10-CM | POA: Diagnosis not present

## 2022-11-16 DIAGNOSIS — Z8673 Personal history of transient ischemic attack (TIA), and cerebral infarction without residual deficits: Secondary | ICD-10-CM | POA: Diagnosis not present

## 2022-11-16 DIAGNOSIS — R404 Transient alteration of awareness: Secondary | ICD-10-CM | POA: Diagnosis not present

## 2022-11-16 DIAGNOSIS — R6889 Other general symptoms and signs: Secondary | ICD-10-CM | POA: Diagnosis not present

## 2022-11-16 DIAGNOSIS — I129 Hypertensive chronic kidney disease with stage 1 through stage 4 chronic kidney disease, or unspecified chronic kidney disease: Secondary | ICD-10-CM | POA: Diagnosis not present

## 2022-11-16 DIAGNOSIS — F0394 Unspecified dementia, unspecified severity, with anxiety: Secondary | ICD-10-CM | POA: Diagnosis not present

## 2022-11-16 DIAGNOSIS — N3281 Overactive bladder: Secondary | ICD-10-CM | POA: Diagnosis not present

## 2022-11-16 DIAGNOSIS — S72002A Fracture of unspecified part of neck of left femur, initial encounter for closed fracture: Secondary | ICD-10-CM | POA: Diagnosis not present

## 2022-11-16 DIAGNOSIS — R410 Disorientation, unspecified: Secondary | ICD-10-CM | POA: Diagnosis not present

## 2022-11-16 DIAGNOSIS — D631 Anemia in chronic kidney disease: Secondary | ICD-10-CM | POA: Diagnosis not present

## 2022-11-16 DIAGNOSIS — D539 Nutritional anemia, unspecified: Secondary | ICD-10-CM | POA: Diagnosis not present

## 2022-11-16 DIAGNOSIS — F0284 Dementia in other diseases classified elsewhere, unspecified severity, with anxiety: Secondary | ICD-10-CM | POA: Diagnosis not present

## 2022-11-16 DIAGNOSIS — N183 Chronic kidney disease, stage 3 unspecified: Secondary | ICD-10-CM | POA: Diagnosis not present

## 2022-11-16 DIAGNOSIS — K219 Gastro-esophageal reflux disease without esophagitis: Secondary | ICD-10-CM | POA: Diagnosis not present

## 2022-11-16 DIAGNOSIS — K5901 Slow transit constipation: Secondary | ICD-10-CM | POA: Diagnosis not present

## 2022-11-16 DIAGNOSIS — Z96641 Presence of right artificial hip joint: Secondary | ICD-10-CM | POA: Diagnosis not present

## 2022-11-16 DIAGNOSIS — S72142D Displaced intertrochanteric fracture of left femur, subsequent encounter for closed fracture with routine healing: Secondary | ICD-10-CM | POA: Diagnosis not present

## 2022-11-16 DIAGNOSIS — N390 Urinary tract infection, site not specified: Secondary | ICD-10-CM | POA: Diagnosis not present

## 2022-11-16 DIAGNOSIS — Z743 Need for continuous supervision: Secondary | ICD-10-CM | POA: Diagnosis not present

## 2022-11-16 DIAGNOSIS — Z853 Personal history of malignant neoplasm of breast: Secondary | ICD-10-CM | POA: Diagnosis not present

## 2022-11-16 DIAGNOSIS — Z888 Allergy status to other drugs, medicaments and biological substances status: Secondary | ICD-10-CM | POA: Diagnosis not present

## 2022-11-16 DIAGNOSIS — S72001D Fracture of unspecified part of neck of right femur, subsequent encounter for closed fracture with routine healing: Secondary | ICD-10-CM | POA: Diagnosis not present

## 2022-11-16 DIAGNOSIS — Z4789 Encounter for other orthopedic aftercare: Secondary | ICD-10-CM | POA: Diagnosis not present

## 2022-11-16 DIAGNOSIS — I10 Essential (primary) hypertension: Secondary | ICD-10-CM | POA: Diagnosis not present

## 2022-11-16 DIAGNOSIS — E039 Hypothyroidism, unspecified: Secondary | ICD-10-CM | POA: Diagnosis not present

## 2022-11-16 DIAGNOSIS — E785 Hyperlipidemia, unspecified: Secondary | ICD-10-CM | POA: Diagnosis not present

## 2022-11-16 DIAGNOSIS — F0283 Dementia in other diseases classified elsewhere, unspecified severity, with mood disturbance: Secondary | ICD-10-CM | POA: Diagnosis not present

## 2022-11-16 DIAGNOSIS — R0902 Hypoxemia: Secondary | ICD-10-CM | POA: Diagnosis not present

## 2022-11-16 DIAGNOSIS — Z96643 Presence of artificial hip joint, bilateral: Secondary | ICD-10-CM | POA: Diagnosis not present

## 2022-11-19 DIAGNOSIS — D631 Anemia in chronic kidney disease: Secondary | ICD-10-CM | POA: Diagnosis not present

## 2022-11-19 DIAGNOSIS — E039 Hypothyroidism, unspecified: Secondary | ICD-10-CM | POA: Diagnosis not present

## 2022-11-19 DIAGNOSIS — I480 Paroxysmal atrial fibrillation: Secondary | ICD-10-CM | POA: Diagnosis not present

## 2022-11-19 DIAGNOSIS — I129 Hypertensive chronic kidney disease with stage 1 through stage 4 chronic kidney disease, or unspecified chronic kidney disease: Secondary | ICD-10-CM | POA: Diagnosis not present

## 2022-11-19 DIAGNOSIS — F0284 Dementia in other diseases classified elsewhere, unspecified severity, with anxiety: Secondary | ICD-10-CM | POA: Diagnosis not present

## 2022-11-19 DIAGNOSIS — F0283 Dementia in other diseases classified elsewhere, unspecified severity, with mood disturbance: Secondary | ICD-10-CM | POA: Diagnosis not present

## 2022-11-19 DIAGNOSIS — E785 Hyperlipidemia, unspecified: Secondary | ICD-10-CM | POA: Diagnosis not present

## 2022-11-19 DIAGNOSIS — S72142D Displaced intertrochanteric fracture of left femur, subsequent encounter for closed fracture with routine healing: Secondary | ICD-10-CM | POA: Diagnosis not present

## 2022-11-19 DIAGNOSIS — N183 Chronic kidney disease, stage 3 unspecified: Secondary | ICD-10-CM | POA: Diagnosis not present

## 2022-11-19 DIAGNOSIS — K219 Gastro-esophageal reflux disease without esophagitis: Secondary | ICD-10-CM | POA: Diagnosis not present

## 2022-11-24 DIAGNOSIS — K5901 Slow transit constipation: Secondary | ICD-10-CM | POA: Diagnosis not present

## 2022-11-24 DIAGNOSIS — E039 Hypothyroidism, unspecified: Secondary | ICD-10-CM | POA: Diagnosis not present

## 2022-11-24 DIAGNOSIS — S72142D Displaced intertrochanteric fracture of left femur, subsequent encounter for closed fracture with routine healing: Secondary | ICD-10-CM | POA: Diagnosis not present

## 2022-11-24 DIAGNOSIS — I10 Essential (primary) hypertension: Secondary | ICD-10-CM | POA: Diagnosis not present

## 2022-11-24 DIAGNOSIS — I480 Paroxysmal atrial fibrillation: Secondary | ICD-10-CM | POA: Diagnosis not present

## 2022-11-27 DIAGNOSIS — I10 Essential (primary) hypertension: Secondary | ICD-10-CM | POA: Diagnosis not present

## 2022-11-27 DIAGNOSIS — R410 Disorientation, unspecified: Secondary | ICD-10-CM | POA: Diagnosis not present

## 2022-11-27 DIAGNOSIS — R6889 Other general symptoms and signs: Secondary | ICD-10-CM | POA: Diagnosis not present

## 2022-11-27 DIAGNOSIS — Z743 Need for continuous supervision: Secondary | ICD-10-CM | POA: Diagnosis not present

## 2022-11-27 DIAGNOSIS — E039 Hypothyroidism, unspecified: Secondary | ICD-10-CM | POA: Diagnosis not present

## 2022-11-27 DIAGNOSIS — E785 Hyperlipidemia, unspecified: Secondary | ICD-10-CM | POA: Diagnosis not present

## 2022-11-27 DIAGNOSIS — Z87891 Personal history of nicotine dependence: Secondary | ICD-10-CM | POA: Diagnosis not present

## 2022-11-27 DIAGNOSIS — R404 Transient alteration of awareness: Secondary | ICD-10-CM | POA: Diagnosis not present

## 2022-11-27 DIAGNOSIS — K219 Gastro-esophageal reflux disease without esophagitis: Secondary | ICD-10-CM | POA: Diagnosis not present

## 2022-11-27 DIAGNOSIS — R0902 Hypoxemia: Secondary | ICD-10-CM | POA: Diagnosis not present

## 2022-11-27 DIAGNOSIS — Z888 Allergy status to other drugs, medicaments and biological substances status: Secondary | ICD-10-CM | POA: Diagnosis not present

## 2022-11-27 DIAGNOSIS — Z79899 Other long term (current) drug therapy: Secondary | ICD-10-CM | POA: Diagnosis not present

## 2022-11-27 DIAGNOSIS — Z853 Personal history of malignant neoplasm of breast: Secondary | ICD-10-CM | POA: Diagnosis not present

## 2022-11-27 DIAGNOSIS — Z96643 Presence of artificial hip joint, bilateral: Secondary | ICD-10-CM | POA: Diagnosis not present

## 2022-11-27 DIAGNOSIS — Z7982 Long term (current) use of aspirin: Secondary | ICD-10-CM | POA: Diagnosis not present

## 2022-11-27 DIAGNOSIS — Z8673 Personal history of transient ischemic attack (TIA), and cerebral infarction without residual deficits: Secondary | ICD-10-CM | POA: Diagnosis not present

## 2022-11-29 DIAGNOSIS — Z96641 Presence of right artificial hip joint: Secondary | ICD-10-CM | POA: Diagnosis not present

## 2022-12-01 DIAGNOSIS — S72001D Fracture of unspecified part of neck of right femur, subsequent encounter for closed fracture with routine healing: Secondary | ICD-10-CM | POA: Diagnosis not present

## 2022-12-01 DIAGNOSIS — I10 Essential (primary) hypertension: Secondary | ICD-10-CM | POA: Diagnosis not present

## 2022-12-01 DIAGNOSIS — D539 Nutritional anemia, unspecified: Secondary | ICD-10-CM | POA: Diagnosis not present

## 2022-12-01 DIAGNOSIS — F32A Depression, unspecified: Secondary | ICD-10-CM | POA: Diagnosis not present

## 2022-12-06 DIAGNOSIS — S72142D Displaced intertrochanteric fracture of left femur, subsequent encounter for closed fracture with routine healing: Secondary | ICD-10-CM | POA: Diagnosis not present

## 2022-12-06 DIAGNOSIS — I10 Essential (primary) hypertension: Secondary | ICD-10-CM | POA: Diagnosis not present

## 2022-12-06 DIAGNOSIS — I480 Paroxysmal atrial fibrillation: Secondary | ICD-10-CM | POA: Diagnosis not present

## 2022-12-06 DIAGNOSIS — E785 Hyperlipidemia, unspecified: Secondary | ICD-10-CM | POA: Diagnosis not present

## 2022-12-07 ENCOUNTER — Telehealth: Payer: Self-pay | Admitting: Medical-Surgical

## 2022-12-07 NOTE — Telephone Encounter (Signed)
Called patient to schedule Medicare Annual Wellness Visit (AWV). Left message for patient to call back and schedule Medicare Annual Wellness Visit (AWV).  Last date of AWV: Never  Please schedule an appointment at any time with [facility].  If any questions, please contact me at [phone].  Thank you ,  Morgan Jessup Patient Access Advocate II Direct Dial: [phone]  

## 2022-12-13 DIAGNOSIS — E039 Hypothyroidism, unspecified: Secondary | ICD-10-CM | POA: Diagnosis not present

## 2022-12-13 DIAGNOSIS — Z7982 Long term (current) use of aspirin: Secondary | ICD-10-CM | POA: Diagnosis not present

## 2022-12-13 DIAGNOSIS — S72001D Fracture of unspecified part of neck of right femur, subsequent encounter for closed fracture with routine healing: Secondary | ICD-10-CM | POA: Diagnosis not present

## 2022-12-13 DIAGNOSIS — Z8673 Personal history of transient ischemic attack (TIA), and cerebral infarction without residual deficits: Secondary | ICD-10-CM | POA: Diagnosis not present

## 2022-12-13 DIAGNOSIS — I480 Paroxysmal atrial fibrillation: Secondary | ICD-10-CM | POA: Diagnosis not present

## 2022-12-13 DIAGNOSIS — D539 Nutritional anemia, unspecified: Secondary | ICD-10-CM | POA: Diagnosis not present

## 2022-12-13 DIAGNOSIS — K219 Gastro-esophageal reflux disease without esophagitis: Secondary | ICD-10-CM | POA: Diagnosis not present

## 2022-12-13 DIAGNOSIS — E785 Hyperlipidemia, unspecified: Secondary | ICD-10-CM | POA: Diagnosis not present

## 2022-12-13 DIAGNOSIS — I10 Essential (primary) hypertension: Secondary | ICD-10-CM | POA: Diagnosis not present

## 2022-12-13 DIAGNOSIS — F0394 Unspecified dementia, unspecified severity, with anxiety: Secondary | ICD-10-CM | POA: Diagnosis not present

## 2022-12-14 ENCOUNTER — Other Ambulatory Visit: Payer: Self-pay | Admitting: Medical-Surgical

## 2022-12-14 ENCOUNTER — Telehealth: Payer: Self-pay | Admitting: General Practice

## 2022-12-14 DIAGNOSIS — N390 Urinary tract infection, site not specified: Secondary | ICD-10-CM | POA: Diagnosis not present

## 2022-12-14 NOTE — Telephone Encounter (Signed)
Called patient to schedule Medicare Annual Wellness Visit (AWV). Left message for patient to call back and schedule Medicare Annual Wellness Visit (AWV).  Last date of AWV: 04/23/2019  Please schedule an appointment at any time with nurse health advisor.  If any questions, please contact me at [phone].  Thank you ,  [place] Charon, RN BSN

## 2023-01-25 DIAGNOSIS — E039 Hypothyroidism, unspecified: Secondary | ICD-10-CM | POA: Diagnosis not present

## 2023-01-25 DIAGNOSIS — S72001D Fracture of unspecified part of neck of right femur, subsequent encounter for closed fracture with routine healing: Secondary | ICD-10-CM | POA: Diagnosis not present

## 2023-01-25 DIAGNOSIS — F0283 Dementia in other diseases classified elsewhere, unspecified severity, with mood disturbance: Secondary | ICD-10-CM | POA: Diagnosis not present

## 2023-01-25 DIAGNOSIS — K219 Gastro-esophageal reflux disease without esophagitis: Secondary | ICD-10-CM | POA: Diagnosis not present

## 2023-01-25 DIAGNOSIS — I480 Paroxysmal atrial fibrillation: Secondary | ICD-10-CM | POA: Diagnosis not present

## 2023-01-25 DIAGNOSIS — E785 Hyperlipidemia, unspecified: Secondary | ICD-10-CM | POA: Diagnosis not present

## 2023-01-25 DIAGNOSIS — I129 Hypertensive chronic kidney disease with stage 1 through stage 4 chronic kidney disease, or unspecified chronic kidney disease: Secondary | ICD-10-CM | POA: Diagnosis not present

## 2023-01-25 DIAGNOSIS — D631 Anemia in chronic kidney disease: Secondary | ICD-10-CM | POA: Diagnosis not present

## 2023-01-25 DIAGNOSIS — N183 Chronic kidney disease, stage 3 unspecified: Secondary | ICD-10-CM | POA: Diagnosis not present

## 2023-01-25 DIAGNOSIS — F0284 Dementia in other diseases classified elsewhere, unspecified severity, with anxiety: Secondary | ICD-10-CM | POA: Diagnosis not present

## 2023-01-28 DIAGNOSIS — I10 Essential (primary) hypertension: Secondary | ICD-10-CM | POA: Diagnosis not present

## 2023-01-28 DIAGNOSIS — F0393 Unspecified dementia, unspecified severity, with mood disturbance: Secondary | ICD-10-CM | POA: Diagnosis not present

## 2023-01-28 DIAGNOSIS — Z8673 Personal history of transient ischemic attack (TIA), and cerebral infarction without residual deficits: Secondary | ICD-10-CM | POA: Diagnosis not present

## 2023-01-31 DIAGNOSIS — E039 Hypothyroidism, unspecified: Secondary | ICD-10-CM | POA: Diagnosis not present

## 2023-02-07 DIAGNOSIS — N183 Chronic kidney disease, stage 3 unspecified: Secondary | ICD-10-CM | POA: Diagnosis not present

## 2023-02-07 DIAGNOSIS — Z8673 Personal history of transient ischemic attack (TIA), and cerebral infarction without residual deficits: Secondary | ICD-10-CM | POA: Diagnosis not present

## 2023-02-07 DIAGNOSIS — F0284 Dementia in other diseases classified elsewhere, unspecified severity, with anxiety: Secondary | ICD-10-CM | POA: Diagnosis not present

## 2023-02-07 DIAGNOSIS — I480 Paroxysmal atrial fibrillation: Secondary | ICD-10-CM | POA: Diagnosis not present

## 2023-02-07 DIAGNOSIS — Z7982 Long term (current) use of aspirin: Secondary | ICD-10-CM | POA: Diagnosis not present

## 2023-02-07 DIAGNOSIS — E785 Hyperlipidemia, unspecified: Secondary | ICD-10-CM | POA: Diagnosis not present

## 2023-02-07 DIAGNOSIS — E039 Hypothyroidism, unspecified: Secondary | ICD-10-CM | POA: Diagnosis not present

## 2023-02-07 DIAGNOSIS — S72142D Displaced intertrochanteric fracture of left femur, subsequent encounter for closed fracture with routine healing: Secondary | ICD-10-CM | POA: Diagnosis not present

## 2023-02-07 DIAGNOSIS — F0283 Dementia in other diseases classified elsewhere, unspecified severity, with mood disturbance: Secondary | ICD-10-CM | POA: Diagnosis not present

## 2023-02-07 DIAGNOSIS — I129 Hypertensive chronic kidney disease with stage 1 through stage 4 chronic kidney disease, or unspecified chronic kidney disease: Secondary | ICD-10-CM | POA: Diagnosis not present

## 2023-02-15 ENCOUNTER — Ambulatory Visit: Payer: Medicare Other | Admitting: Medical-Surgical

## 2023-03-10 DEATH — deceased
